# Patient Record
Sex: Female | Born: 1975 | Hispanic: Yes | Marital: Single | Smoking: Never smoker
Health system: Southern US, Community
[De-identification: ages and names within clinical notes are randomized; demographics above are authoritative.]

## PROBLEM LIST (undated history)

## (undated) DIAGNOSIS — E119 Type 2 diabetes mellitus without complications: Secondary | ICD-10-CM

---

## 2000-10-04 ENCOUNTER — Other Ambulatory Visit: Admission: RE | Admit: 2000-10-04 | Discharge: 2000-10-04 | Payer: Self-pay | Admitting: Obstetrics and Gynecology

## 2000-11-16 ENCOUNTER — Inpatient Hospital Stay (HOSPITAL_COMMUNITY): Admission: RE | Admit: 2000-11-16 | Discharge: 2000-11-18 | Payer: Self-pay | Admitting: Obstetrics and Gynecology

## 2001-03-24 ENCOUNTER — Emergency Department (HOSPITAL_COMMUNITY): Admission: EM | Admit: 2001-03-24 | Discharge: 2001-03-24 | Payer: Self-pay | Admitting: *Deleted

## 2015-01-29 ENCOUNTER — Other Ambulatory Visit (HOSPITAL_COMMUNITY): Payer: Self-pay | Admitting: Nurse Practitioner

## 2015-01-29 DIAGNOSIS — Z1231 Encounter for screening mammogram for malignant neoplasm of breast: Secondary | ICD-10-CM

## 2015-02-17 ENCOUNTER — Ambulatory Visit (HOSPITAL_COMMUNITY): Payer: Self-pay

## 2015-05-01 ENCOUNTER — Inpatient Hospital Stay (HOSPITAL_COMMUNITY): Payer: Medicaid Other

## 2015-05-01 ENCOUNTER — Inpatient Hospital Stay (HOSPITAL_COMMUNITY)
Admission: EM | Admit: 2015-05-01 | Discharge: 2015-05-12 | DRG: 536 | Disposition: A | Discharge status: Home / Self Care | Payer: Medicaid Other | Attending: General Surgery | Admitting: General Surgery

## 2015-05-01 ENCOUNTER — Emergency Department (HOSPITAL_COMMUNITY): Payer: Medicaid Other

## 2015-05-01 ENCOUNTER — Encounter (HOSPITAL_COMMUNITY): Payer: Self-pay | Admitting: *Deleted

## 2015-05-01 DIAGNOSIS — R52 Pain, unspecified: Secondary | ICD-10-CM

## 2015-05-01 DIAGNOSIS — S32810A Multiple fractures of pelvis with stable disruption of pelvic ring, initial encounter for closed fracture: Secondary | ICD-10-CM | POA: Diagnosis present

## 2015-05-01 DIAGNOSIS — S069XAA Unspecified intracranial injury with loss of consciousness status unknown, initial encounter: Secondary | ICD-10-CM | POA: Diagnosis present

## 2015-05-01 DIAGNOSIS — I959 Hypotension, unspecified: Secondary | ICD-10-CM | POA: Diagnosis present

## 2015-05-01 DIAGNOSIS — M25421 Effusion, right elbow: Secondary | ICD-10-CM

## 2015-05-01 DIAGNOSIS — IMO0002 Reserved for concepts with insufficient information to code with codable children: Secondary | ICD-10-CM

## 2015-05-01 DIAGNOSIS — S069X0A Unspecified intracranial injury without loss of consciousness, initial encounter: Secondary | ICD-10-CM

## 2015-05-01 DIAGNOSIS — R000 Tachycardia, unspecified: Secondary | ICD-10-CM | POA: Diagnosis present

## 2015-05-01 DIAGNOSIS — S069X9A Unspecified intracranial injury with loss of consciousness of unspecified duration, initial encounter: Secondary | ICD-10-CM | POA: Diagnosis present

## 2015-05-01 DIAGNOSIS — S060X0A Concussion without loss of consciousness, initial encounter: Secondary | ICD-10-CM | POA: Diagnosis present

## 2015-05-01 DIAGNOSIS — D62 Acute posthemorrhagic anemia: Secondary | ICD-10-CM | POA: Diagnosis present

## 2015-05-01 DIAGNOSIS — S3282XA Multiple fractures of pelvis without disruption of pelvic ring, initial encounter for closed fracture: Secondary | ICD-10-CM | POA: Diagnosis present

## 2015-05-01 DIAGNOSIS — M542 Cervicalgia: Secondary | ICD-10-CM

## 2015-05-01 HISTORY — DX: Type 2 diabetes mellitus without complications: E11.9

## 2015-05-01 LAB — PROTIME-INR
INR: 1.15 (ref 0.00–1.49)
PROTHROMBIN TIME: 14.9 s (ref 11.6–15.2)

## 2015-05-01 LAB — COMPREHENSIVE METABOLIC PANEL
ALBUMIN: 3.2 g/dL — AB (ref 3.5–5.0)
ALT: 71 U/L — ABNORMAL HIGH (ref 14–54)
ANION GAP: 10 (ref 5–15)
AST: 119 U/L — ABNORMAL HIGH (ref 15–41)
Alkaline Phosphatase: 67 U/L (ref 38–126)
BILIRUBIN TOTAL: 0.2 mg/dL — AB (ref 0.3–1.2)
BUN: 11 mg/dL (ref 6–20)
CO2: 23 mmol/L (ref 22–32)
Calcium: 8.1 mg/dL — ABNORMAL LOW (ref 8.9–10.3)
Chloride: 106 mmol/L (ref 101–111)
Creatinine, Ser: 0.81 mg/dL (ref 0.44–1.00)
GFR calc Af Amer: >60 mL/min (ref >60)
GFR calc non Af Amer: >60 mL/min (ref >60)
GLUCOSE: 185 mg/dL — AB (ref 65–99)
POTASSIUM: 3.2 mmol/L — AB (ref 3.5–5.1)
SODIUM: 139 mmol/L (ref 135–145)
TOTAL PROTEIN: 5.7 g/dL — AB (ref 6.5–8.1)

## 2015-05-01 LAB — CBC
HCT: 29.7 % — ABNORMAL LOW (ref 36.0–46.0)
HEMATOCRIT: 33.3 % — AB (ref 36.0–46.0)
HEMOGLOBIN: 11.1 g/dL — AB (ref 12.0–15.0)
Hemoglobin: 10.1 g/dL — ABNORMAL LOW (ref 12.0–15.0)
MCH: 30.3 pg (ref 26.0–34.0)
MCH: 30.3 pg (ref 26.0–34.0)
MCHC: 33.3 g/dL (ref 30.0–36.0)
MCHC: 34 g/dL (ref 30.0–36.0)
MCV: 91 fL (ref 78.0–100.0)
MCV: 89.2 fL (ref 78.0–100.0)
Platelets: 237 10*3/uL (ref 150–400)
Platelets: 175 10*3/uL (ref 150–400)
RBC: 3.66 MIL/uL — AB (ref 3.87–5.11)
RBC: 3.33 MIL/uL — ABNORMAL LOW (ref 3.87–5.11)
RDW: 12.6 % (ref 11.5–15.5)
RDW: 13 % (ref 11.5–15.5)
WBC: 13.6 10*3/uL — ABNORMAL HIGH (ref 4.0–10.5)
WBC: 6.6 10*3/uL (ref 4.0–10.5)

## 2015-05-01 LAB — URINALYSIS, ROUTINE W REFLEX MICROSCOPIC
BILIRUBIN URINE: NEGATIVE
GLUCOSE, UA: NEGATIVE mg/dL
KETONES UR: NEGATIVE mg/dL
Leukocytes, UA: NEGATIVE
NITRITE: NEGATIVE
PH: 6 (ref 5.0–8.0)
PROTEIN: NEGATIVE mg/dL
Specific Gravity, Urine: 1.021 (ref 1.005–1.030)

## 2015-05-01 LAB — I-STAT CHEM 8, ED
BUN: 11 mg/dL (ref 6–20)
CALCIUM ION: 1.1 mmol/L — AB (ref 1.12–1.23)
CHLORIDE: 105 mmol/L (ref 101–111)
Creatinine, Ser: 0.7 mg/dL (ref 0.44–1.00)
Glucose, Bld: 178 mg/dL — ABNORMAL HIGH (ref 65–99)
HCT: 37 % (ref 36.0–46.0)
HEMOGLOBIN: 12.6 g/dL (ref 12.0–15.0)
Potassium: 3.2 mmol/L — ABNORMAL LOW (ref 3.5–5.1)
SODIUM: 140 mmol/L (ref 135–145)
TCO2: 21 mmol/L (ref 0–100)

## 2015-05-01 LAB — SAMPLE TO BLOOD BANK

## 2015-05-01 LAB — PREPARE RBC (CROSSMATCH)

## 2015-05-01 LAB — URINE MICROSCOPIC-ADD ON

## 2015-05-01 LAB — I-STAT BETA HCG BLOOD, ED (MC, WL, AP ONLY)

## 2015-05-01 LAB — ETHANOL: Alcohol, Ethyl (B): <5 mg/dL (ref <5)

## 2015-05-01 LAB — ABO/RH: ABO/RH(D): O POS

## 2015-05-01 LAB — CDS SEROLOGY

## 2015-05-01 MED ORDER — ONDANSETRON HCL 4 MG/2ML IJ SOLN
INTRAMUSCULAR | Status: AC
  Filled 2015-05-01: qty 2

## 2015-05-01 MED ORDER — SODIUM CHLORIDE 0.9 % IV SOLN
INTRAVENOUS | Status: AC | PRN
  Administered 2015-05-01 (×4): 1000 mL via INTRAVENOUS

## 2015-05-01 MED ORDER — ONDANSETRON HCL 4 MG/2ML IJ SOLN
4.0000 mg | Freq: Four times a day (QID) | INTRAMUSCULAR | Status: DC | PRN
  Administered 2015-05-01 – 2015-05-05 (×4): 4 mg via INTRAVENOUS
  Filled 2015-05-01 (×5): qty 2

## 2015-05-01 MED ORDER — POLYETHYLENE GLYCOL 3350 17 G PO PACK
17.0000 g | PACK | Freq: Every day | ORAL | Status: DC
  Administered 2015-05-03 – 2015-05-12 (×10): 17 g via ORAL
  Filled 2015-05-01 (×11): qty 1

## 2015-05-01 MED ORDER — ONDANSETRON HCL 4 MG PO TABS
4.0000 mg | ORAL_TABLET | Freq: Four times a day (QID) | ORAL | Status: DC | PRN
  Administered 2015-05-04 – 2015-05-07 (×3): 4 mg via ORAL
  Filled 2015-05-01 (×3): qty 1

## 2015-05-01 MED ORDER — SODIUM CHLORIDE 0.9 % IJ SOLN: 9.0000 mL | INTRAMUSCULAR | Status: DC | PRN

## 2015-05-01 MED ORDER — SODIUM CHLORIDE 0.9 % IV SOLN: Freq: Once | INTRAVENOUS | Status: AC

## 2015-05-01 MED ORDER — NALOXONE HCL 0.4 MG/ML IJ SOLN: 0.4000 mg | INTRAMUSCULAR | Status: DC | PRN

## 2015-05-01 MED ORDER — PANTOPRAZOLE SODIUM 40 MG IV SOLR
40.0000 mg | Freq: Every day | INTRAVENOUS | Status: DC
  Administered 2015-05-02: 40 mg via INTRAVENOUS
  Filled 2015-05-01: qty 40

## 2015-05-01 MED ORDER — ONDANSETRON HCL 4 MG/2ML IJ SOLN
4.0000 mg | Freq: Once | INTRAMUSCULAR | Status: AC
  Administered 2015-05-01: 4 mg via INTRAVENOUS
  Filled 2015-05-01: qty 2

## 2015-05-01 MED ORDER — DIPHENHYDRAMINE HCL 50 MG/ML IJ SOLN: 12.5000 mg | Freq: Four times a day (QID) | INTRAMUSCULAR | Status: DC | PRN

## 2015-05-01 MED ORDER — IOHEXOL 300 MG/ML  SOLN
100.0000 mL | Freq: Once | INTRAMUSCULAR | Status: AC | PRN
  Administered 2015-05-01: 100 mL via INTRAVENOUS

## 2015-05-01 MED ORDER — ONDANSETRON HCL 4 MG/2ML IJ SOLN
4.0000 mg | Freq: Once | INTRAMUSCULAR | Status: AC
  Administered 2015-05-01: 4 mg via INTRAVENOUS

## 2015-05-01 MED ORDER — PANTOPRAZOLE SODIUM 40 MG PO TBEC
40.0000 mg | DELAYED_RELEASE_TABLET | Freq: Every day | ORAL | Status: DC
  Administered 2015-05-01 – 2015-05-12 (×11): 40 mg via ORAL
  Filled 2015-05-01 (×11): qty 1

## 2015-05-01 MED ORDER — FENTANYL CITRATE (PF) 100 MCG/2ML IJ SOLN
100.0000 ug | Freq: Once | INTRAMUSCULAR | Status: AC
  Administered 2015-05-01: 100 ug via INTRAVENOUS
  Filled 2015-05-01: qty 2

## 2015-05-01 MED ORDER — MORPHINE SULFATE 2 MG/ML IV SOLN
INTRAVENOUS | Status: DC
  Administered 2015-05-01: 13 mg via INTRAVENOUS
  Administered 2015-05-01: 19:00:00 via INTRAVENOUS
  Filled 2015-05-01: qty 25

## 2015-05-01 MED ORDER — KCL IN DEXTROSE-NACL 40-5-0.45 MEQ/L-%-% IV SOLN
INTRAVENOUS | Status: DC
  Administered 2015-05-01 – 2015-05-02 (×2): via INTRAVENOUS
  Administered 2015-05-02: 125 mL/h via INTRAVENOUS
  Administered 2015-05-03: 21:00:00 via INTRAVENOUS
  Administered 2015-05-03: 75 mL/h via INTRAVENOUS
  Administered 2015-05-04: 50 mL/h via INTRAVENOUS
  Administered 2015-05-05 – 2015-05-06 (×3): via INTRAVENOUS
  Filled 2015-05-01 (×13): qty 1000

## 2015-05-01 MED ORDER — DIPHENHYDRAMINE HCL 12.5 MG/5ML PO ELIX: 12.5000 mg | ORAL_SOLUTION | Freq: Four times a day (QID) | ORAL | Status: DC | PRN

## 2015-05-01 MED ORDER — DOCUSATE SODIUM 100 MG PO CAPS
100.0000 mg | ORAL_CAPSULE | Freq: Two times a day (BID) | ORAL | Status: DC
  Administered 2015-05-01 – 2015-05-12 (×21): 100 mg via ORAL
  Filled 2015-05-01 (×21): qty 1

## 2015-05-01 MED ORDER — FENTANYL CITRATE (PF) 100 MCG/2ML IJ SOLN
50.0000 ug | INTRAMUSCULAR | Status: DC | PRN
  Administered 2015-05-01 – 2015-05-02 (×5): 50 ug via INTRAVENOUS
  Filled 2015-05-01 (×6): qty 2

## 2015-05-01 NOTE — ED Notes (Signed)
Pt escorted to CT by this RN.

## 2015-05-01 NOTE — ED Notes (Signed)
Plasma started at 16020mL/hr verified with Tammy SoursGreg, RN

## 2015-05-01 NOTE — ED Notes (Signed)
Pt being taken to CT scan now by Marga HootsNicole Stephens, RN

## 2015-05-01 NOTE — ED Notes (Signed)
Plasma increased to 26700mL/hr
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
## 2015-05-01 NOTE — ED Notes (Signed)
X-ray at bedside
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
## 2015-05-01 NOTE — Progress Notes (Signed)
[facility]   Consult requested for multiple pelvic ring fractures  Attempted see patient in the emergency department but she is off for this time  All pelvic imaging as well as her extremity images were reviewed  No acute findings noted to her right lower leg or right femur. No acute findings noted to the right foot  CT of her abdomen pelvis is notable for a incomplete fracture of her S1 sacral body. There does appear to be slight pars fracture on the left side as well. I do not appreciate any significant extension of sacral body fracture anteriorly or posteriorly. It seems to be isolated within the body itself. There also are bilateral superior rami fractures as well as a left inferior rami fracture. Also appears be some small avulsions around the pubic symphysis. The SI joints appear to be symmetric as well without gross injury.  Plan for a complete exam tomorrow  Based on the imaging studies we do believe we treated nonoperatively however we will have the patient be npo after midnight in case clinical findings dictate otherwise  Continue bedrest for now  Patient also appears to have responded to blood product as her H&H are   improved and her vitals are stable  Seems unlikely that her pelvic ring fractures cause her significant hypotension.  I have ordered additional pelvic x-rays as well  Mearl LatinKeith W. Colten Desroches, PA-C [facility] [phone] (P) 05/01/2015 3:23 PM

## 2015-05-01 NOTE — ED Notes (Signed)
Dr. Criss AlvineGoldston informed of pts BP decreasing to 85/72.
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
## 2015-05-02 ENCOUNTER — Inpatient Hospital Stay (HOSPITAL_COMMUNITY): Payer: Medicaid Other

## 2015-05-02 LAB — CBC
HEMATOCRIT: 31.4 % — AB (ref 36.0–46.0)
HEMATOCRIT: 33.6 % — AB (ref 36.0–46.0)
HEMATOCRIT: 37 % (ref 36.0–46.0)
HEMOGLOBIN: 10.5 g/dL — AB (ref 12.0–15.0)
HEMOGLOBIN: 12 g/dL (ref 12.0–15.0)
Hemoglobin: 11.3 g/dL — ABNORMAL LOW (ref 12.0–15.0)
MCH: 29.9 pg (ref 26.0–34.0)
MCH: 30.6 pg (ref 26.0–34.0)
MCH: 29.6 pg (ref 26.0–34.0)
MCHC: 33.4 g/dL (ref 30.0–36.0)
MCHC: 33.6 g/dL (ref 30.0–36.0)
MCHC: 32.4 g/dL (ref 30.0–36.0)
MCV: 89.5 fL (ref 78.0–100.0)
MCV: 91.1 fL (ref 78.0–100.0)
MCV: 91.1 fL (ref 78.0–100.0)
PLATELETS: 167 10*3/uL (ref 150–400)
Platelets: 182 10*3/uL (ref 150–400)
Platelets: 179 10*3/uL (ref 150–400)
RBC: 3.51 MIL/uL — ABNORMAL LOW (ref 3.87–5.11)
RBC: 3.69 MIL/uL — AB (ref 3.87–5.11)
RBC: 4.06 MIL/uL (ref 3.87–5.11)
RDW: 13.5 % (ref 11.5–15.5)
RDW: 13.8 % (ref 11.5–15.5)
RDW: 13.8 % (ref 11.5–15.5)
WBC: 6.5 10*3/uL (ref 4.0–10.5)
WBC: 6.2 10*3/uL (ref 4.0–10.5)
WBC: 6.7 10*3/uL (ref 4.0–10.5)

## 2015-05-02 LAB — PREPARE FRESH FROZEN PLASMA
UNIT DIVISION: 0
UNIT DIVISION: 0

## 2015-05-02 LAB — MRSA PCR SCREENING: MRSA by PCR: NEGATIVE

## 2015-05-02 LAB — BASIC METABOLIC PANEL
Anion gap: 6 (ref 5–15)
CHLORIDE: 111 mmol/L (ref 101–111)
CO2: 22 mmol/L (ref 22–32)
Calcium: 8 mg/dL — ABNORMAL LOW (ref 8.9–10.3)
Creatinine, Ser: 0.56 mg/dL (ref 0.44–1.00)
GFR calc Af Amer: >60 mL/min (ref >60)
GLUCOSE: 140 mg/dL — AB (ref 65–99)
POTASSIUM: 3.9 mmol/L (ref 3.5–5.1)
Sodium: 139 mmol/L (ref 135–145)

## 2015-05-02 LAB — BLOOD PRODUCT ORDER (VERBAL) VERIFICATION

## 2015-05-02 LAB — TYPE AND SCREEN
ABO/RH(D): O POS
Antibody Screen: NEGATIVE
UNIT DIVISION: 0
Unit division: 0
Unit division: 0

## 2015-05-02 MED ORDER — CETYLPYRIDINIUM CHLORIDE 0.05 % MT LIQD
7.0000 mL | Freq: Two times a day (BID) | OROMUCOSAL | Status: DC
  Administered 2015-05-02 – 2015-05-04 (×4): 7 mL via OROMUCOSAL

## 2015-05-02 MED ORDER — SODIUM CHLORIDE 0.9 % IJ SOLN: 9.0000 mL | INTRAMUSCULAR | Status: DC | PRN

## 2015-05-02 MED ORDER — NALOXONE HCL 0.4 MG/ML IJ SOLN: 0.4000 mg | INTRAMUSCULAR | Status: DC | PRN

## 2015-05-02 MED ORDER — DIPHENHYDRAMINE HCL 50 MG/ML IJ SOLN: 12.5000 mg | Freq: Four times a day (QID) | INTRAMUSCULAR | Status: DC | PRN

## 2015-05-02 MED ORDER — DIPHENHYDRAMINE HCL 50 MG/ML IJ SOLN
12.5000 mg | Freq: Four times a day (QID) | INTRAMUSCULAR | Status: DC | PRN
  Administered 2015-05-03: 12.5 mg via INTRAVENOUS
  Filled 2015-05-02: qty 1

## 2015-05-02 MED ORDER — DIPHENHYDRAMINE HCL 12.5 MG/5ML PO ELIX: 12.5000 mg | ORAL_SOLUTION | Freq: Four times a day (QID) | ORAL | Status: DC | PRN

## 2015-05-02 MED ORDER — HYDROMORPHONE 1 MG/ML IV SOLN
INTRAVENOUS | Status: DC
  Administered 2015-05-02: 2.5 mg via INTRAVENOUS
  Administered 2015-05-02: 0.5 mg via INTRAVENOUS
  Administered 2015-05-02: 1.5 mg via INTRAVENOUS
  Administered 2015-05-03: 1.2 mg via INTRAVENOUS
  Administered 2015-05-03: 2 mg via INTRAVENOUS
  Administered 2015-05-03: 2.09 mg via INTRAVENOUS
  Administered 2015-05-03: 1.5 mg via INTRAVENOUS
  Administered 2015-05-04: 0.5 mg via INTRAVENOUS
  Filled 2015-05-02: qty 25

## 2015-05-02 MED ORDER — ONDANSETRON HCL 4 MG/2ML IJ SOLN: 4.0000 mg | Freq: Four times a day (QID) | INTRAMUSCULAR | Status: DC | PRN

## 2015-05-02 MED ORDER — ACETAMINOPHEN 325 MG PO TABS
325.0000 mg | ORAL_TABLET | Freq: Four times a day (QID) | ORAL | Status: DC | PRN
  Administered 2015-05-02 – 2015-05-10 (×8): 650 mg via ORAL
  Filled 2015-05-02 (×9): qty 2

## 2015-05-02 MED ORDER — METHOCARBAMOL 1000 MG/10ML IJ SOLN
1000.0000 mg | Freq: Three times a day (TID) | INTRAVENOUS | Status: DC
  Administered 2015-05-02 – 2015-05-05 (×9): 1000 mg via INTRAVENOUS
  Filled 2015-05-02 (×11): qty 10

## 2015-05-02 MED ORDER — PROMETHAZINE HCL 25 MG/ML IJ SOLN
12.5000 mg | Freq: Four times a day (QID) | INTRAMUSCULAR | Status: DC | PRN
  Administered 2015-05-02: 12.5 mg via INTRAVENOUS
  Administered 2015-05-03: 25 mg via INTRAVENOUS
  Filled 2015-05-02 (×3): qty 1

## 2015-05-02 MED ORDER — MORPHINE SULFATE 2 MG/ML IV SOLN
INTRAVENOUS | Status: DC
  Administered 2015-05-02: 3 mg via INTRAVENOUS
  Administered 2015-05-02: 0 mg via INTRAVENOUS
  Administered 2015-05-02: 9 mg via INTRAVENOUS

## 2015-05-02 NOTE — Progress Notes (Signed)
Inpatient Rehabilitation  Patient was screened by Fae PippinMelissa Taisia Fantini for appropriateness for an Inpatient Acute Rehab consult.  Patient may be appropriate for IP Rehab; however, pain, N/V appears to be limiting her ability to get out of bed at this time.  Will follow along for progress with therapies and appropriateness for a rehab consult.   Charlane FerrettiMelissa Keny Donald, M.A., CCC/SLP Admission Coordinator  [facility]  Cell [phone]

## 2015-05-02 NOTE — Progress Notes (Signed)
Dr. Janee Mornhompson notified patient's pain still 8-10 with PCA pump. Orders received to change PCA to full dose. Will monitor.
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
## 2015-05-02 NOTE — Progress Notes (Signed)
Attempted report X2 to 3S Water engineerN and Charge RN. No one was able to take report at this time.
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
## 2015-05-03 ENCOUNTER — Encounter (HOSPITAL_COMMUNITY): Payer: Self-pay | Admitting: *Deleted

## 2015-05-03 MED ORDER — ENOXAPARIN SODIUM 40 MG/0.4ML ~~LOC~~ SOLN
40.0000 mg | SUBCUTANEOUS | Status: DC
  Administered 2015-05-03 – 2015-05-12 (×10): 40 mg via SUBCUTANEOUS
  Filled 2015-05-03 (×10): qty 0.4
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
## 2015-05-03 NOTE — Progress Notes (Signed)
Pt continues to have headache, encouraged pt to use PCA, educated pt and family at bedside about PCA and that pt is the only one who can push the PCA button to deliver pain medication. They state they understand. Pt's daughter speaks [place] well, pt and her husband understand [place] well. Place ice pack behind pt's head around neck area per her request to help with pain. Marisue Ivanobyn Kito Cuffe RN

## 2015-05-04 MED ORDER — OXYCODONE HCL 5 MG PO TABS
5.0000 mg | ORAL_TABLET | ORAL | Status: DC | PRN
  Administered 2015-05-04 – 2015-05-05 (×5): 10 mg via ORAL
  Filled 2015-05-04 (×5): qty 2

## 2015-05-04 MED ORDER — HYDROMORPHONE HCL 1 MG/ML IJ SOLN
0.5000 mg | INTRAMUSCULAR | Status: DC | PRN
  Administered 2015-05-04 – 2015-05-05 (×5): 2 mg via INTRAVENOUS
  Filled 2015-05-04 (×5): qty 2
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
## 2015-05-04 NOTE — Progress Notes (Signed)
Wasted 6mls dilaudid from d/c'd PCA with Agricultural consultantCelenia RN. Marisue Ivanobyn Blake Vetrano RN
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
## 2015-05-05 ENCOUNTER — Inpatient Hospital Stay (HOSPITAL_COMMUNITY): Payer: Medicaid Other

## 2015-05-05 LAB — CBC
HEMATOCRIT: 35.7 % — AB (ref 36.0–46.0)
HEMOGLOBIN: 12.1 g/dL (ref 12.0–15.0)
MCH: 30.8 pg (ref 26.0–34.0)
MCHC: 33.9 g/dL (ref 30.0–36.0)
MCV: 90.8 fL (ref 78.0–100.0)
Platelets: 219 10*3/uL (ref 150–400)
RBC: 3.93 MIL/uL (ref 3.87–5.11)
RDW: 12.9 % (ref 11.5–15.5)
WBC: 10.3 10*3/uL (ref 4.0–10.5)

## 2015-05-05 MED ORDER — TRAMADOL HCL 50 MG PO TABS
50.0000 mg | ORAL_TABLET | Freq: Four times a day (QID) | ORAL | Status: DC
  Administered 2015-05-05 – 2015-05-12 (×30): 100 mg via ORAL
  Filled 2015-05-05 (×30): qty 2

## 2015-05-05 MED ORDER — OXYCODONE HCL 5 MG PO TABS
5.0000 mg | ORAL_TABLET | ORAL | Status: DC | PRN
  Administered 2015-05-05: 5 mg via ORAL
  Administered 2015-05-06 – 2015-05-08 (×14): 15 mg via ORAL
  Administered 2015-05-09: 10 mg via ORAL
  Administered 2015-05-09 (×2): 15 mg via ORAL
  Administered 2015-05-11 – 2015-05-12 (×3): 10 mg via ORAL
  Filled 2015-05-05 (×6): qty 3
  Filled 2015-05-05: qty 2
  Filled 2015-05-05 (×3): qty 3
  Filled 2015-05-05: qty 2
  Filled 2015-05-05: qty 1
  Filled 2015-05-05: qty 3
  Filled 2015-05-05: qty 2
  Filled 2015-05-05 (×7): qty 3

## 2015-05-05 MED ORDER — HYDROMORPHONE HCL 1 MG/ML IJ SOLN
0.5000 mg | INTRAMUSCULAR | Status: DC | PRN
  Administered 2015-05-05: 2 mg via INTRAVENOUS
  Filled 2015-05-05: qty 2

## 2015-05-05 MED ORDER — METHOCARBAMOL 500 MG PO TABS: 1000.0000 mg | ORAL_TABLET | Freq: Three times a day (TID) | ORAL | Status: DC

## 2015-05-05 MED ORDER — METHOCARBAMOL 500 MG PO TABS
1000.0000 mg | ORAL_TABLET | Freq: Three times a day (TID) | ORAL | Status: DC
  Administered 2015-05-05 – 2015-05-12 (×22): 1000 mg via ORAL
  Filled 2015-05-05 (×22): qty 2
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
## 2015-05-06 DIAGNOSIS — S069X9S Unspecified intracranial injury with loss of consciousness of unspecified duration, sequela: Secondary | ICD-10-CM

## 2015-05-06 DIAGNOSIS — S069XAA Unspecified intracranial injury with loss of consciousness status unknown, initial encounter: Secondary | ICD-10-CM | POA: Diagnosis present

## 2015-05-06 DIAGNOSIS — R52 Pain, unspecified: Secondary | ICD-10-CM | POA: Diagnosis present

## 2015-05-06 DIAGNOSIS — S069X9A Unspecified intracranial injury with loss of consciousness of unspecified duration, initial encounter: Secondary | ICD-10-CM | POA: Diagnosis present

## 2015-05-06 DIAGNOSIS — R000 Tachycardia, unspecified: Secondary | ICD-10-CM | POA: Diagnosis present

## 2015-05-06 DIAGNOSIS — S32810A Multiple fractures of pelvis with stable disruption of pelvic ring, initial encounter for closed fracture: Secondary | ICD-10-CM | POA: Diagnosis present

## 2015-05-06 MED ORDER — BISACODYL 10 MG RE SUPP
10.0000 mg | Freq: Every day | RECTAL | Status: DC | PRN
  Administered 2015-05-07: 10 mg via RECTAL
  Filled 2015-05-06: qty 1

## 2015-05-06 NOTE — Progress Notes (Signed)
Interpreter [name] for [facility]
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
## 2015-05-06 NOTE — Progress Notes (Signed)
Inpatient Rehabilitation  I visited pt. At the bedside to attempt to discuss her progress in therapies and possible need for post acute rehab.  Pt's uncle Lynne Loganrnesto present and speaking a little [place].  Through uncle, I advised pt. That I will return tomorrow hopefully with interpreter to discuss the above.  Pt. In agreement.  Please call if questions.  Weldon PickingSusan Kevyn Boquet PT Inpatient Rehab Admissions Coordinator Cell [phone] Office [phone]

## 2015-05-07 ENCOUNTER — Inpatient Hospital Stay (HOSPITAL_COMMUNITY): Payer: Medicaid Other

## 2015-05-07 MED ORDER — GLUCERNA SHAKE PO LIQD
237.0000 mL | Freq: Three times a day (TID) | ORAL | Status: DC
  Administered 2015-05-07 – 2015-05-12 (×11): 237 mL via ORAL
  Filled 2015-05-07: qty 237
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
## 2015-05-07 NOTE — Progress Notes (Signed)
Report called pt transferring to [id number].

## 2015-05-07 NOTE — Progress Notes (Signed)
Inpatient Rehabilitation  I met at the bedside with the patient, her 41 year old daughter Grant Fontana .  Lesle Chris provided interpretation.  I was able to observe pt. In her PT/OT session as well.  Pt. Currently with ongoing functional limitations due to pain and some dizziness.  ST consult pending for cognitive issues.  Pt. with heart rate max 138 with limited activity.  Will continue to follow for possible need for IP Rehab and for medical readiness.  Discussed with Jomarie Longs, trauma PA.  Please call if questions.  [place] Admissions Coordinator Cell [phone] Office [phone]
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
## 2015-05-07 NOTE — Progress Notes (Signed)
Interpreter [name] for Durward MallardJulie OT, MeganPT, Susan [facility]
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
## 2015-05-08 MED ORDER — WHITE PETROLATUM GEL
Status: AC
  Administered 2015-05-08: 13:00:00
  Filled 2015-05-08: qty 1
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
## 2015-05-08 NOTE — Progress Notes (Signed)
Interpreter [name] for Physical Therapist Aundra MilletMegan
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
## 2015-05-09 NOTE — Progress Notes (Addendum)
Inpatient Rehabilitation  Met with patient at bedside with interpreter, Spero Geralds present and called husband via speaker phone to discuss potential IP Rehab admission.  Notified of need for 24/7 supervision.  Husband asked his mother who agreed to care for patient.  Plan to meeting with patient and her mother-in-law Monday @ 90 with interpreter to solidify discharge support.    Carmelia Roller., CCC/SLP Admission Coordinator  [place]  Cell [phone]
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
## 2015-05-09 NOTE — Progress Notes (Signed)
Interpreter [name] for [facility], and  [facility]
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
## 2015-05-12 MED ORDER — METHOCARBAMOL 500 MG PO TABS: 1000.0000 mg | ORAL_TABLET | Freq: Three times a day (TID) | ORAL | Status: DC | PRN

## 2015-05-12 MED ORDER — ACETAMINOPHEN 325 MG PO TABS: 325.0000 mg | ORAL_TABLET | Freq: Four times a day (QID) | ORAL | Status: DC | PRN

## 2015-05-12 MED ORDER — POLYETHYLENE GLYCOL 3350 17 G PO PACK: 17.0000 g | PACK | Freq: Every day | ORAL | Status: DC

## 2015-05-12 MED ORDER — TRAMADOL HCL 50 MG PO TABS: 50.0000 mg | ORAL_TABLET | Freq: Four times a day (QID) | ORAL | Status: DC | PRN

## 2015-05-12 MED ORDER — OXYCODONE HCL 5 MG PO TABS: 5.0000 mg | ORAL_TABLET | ORAL | Status: DC | PRN

## 2015-05-12 MED ORDER — GLUCERNA SHAKE PO LIQD
237.0000 mL | Freq: Three times a day (TID) | ORAL | Status: DC
Start: 2015-05-12 — End: 2019-04-22

## 2015-05-12 MED ORDER — DOCUSATE SODIUM 100 MG PO CAPS: 100.0000 mg | ORAL_CAPSULE | Freq: Two times a day (BID) | ORAL | Status: DC

## 2015-05-12 NOTE — Progress Notes (Signed)
Inpatient Rehabilitation  I met the patient and her mother in law at the bedside to discuss rehab plans for pt.  Per PT note this am, pt. supervision and min assist 100'  ambulation .  Pt. in room walking with RW and supervision currently.  Pt. appears much more comfortable and is moving more freely.  Pt. no longer requires the intensity of the CIR program .  Pt's mother in law and pt. state that they feel like they will manage fine at home and that pt. Will have 24 hour supervision and assist as needed.   All communication was done through [employer], interpreter.  I have updated Ellan Lambert, RNCM.  I will sign off.  Please call if questions.  [place] Admissions Coordinator Cell [phone] Office [phone]
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
## 2015-05-12 NOTE — Progress Notes (Signed)
Interpreter [name] for [facility]

## 2015-05-12 NOTE — Progress Notes (Signed)
Pt discharged to home. Discharge instructions explained to pt through family member. Contacted case management to get pt assistance with getting prescriptions and pt was able to get a discount coupon.  Pt stated she had all belongings.  Pt ambulated off unit on her own.

## 2015-05-12 NOTE — Progress Notes (Signed)
CSW reviewed Pt's chart and the Pt would like to d/c home with 24 hours supervision.   CSW signing off.   Lenord CarboCassandra Aaleigha Bozza  LCSWA [facility]  [phone]

## 2015-05-12 NOTE — Progress Notes (Addendum)
ED[facility].  Patient without insurance, to be discharged home with family.  [facility] provided patient with coupons from [employer] to receive discount prices for medications at [employer].  RN has discussed this with patient and patient is agreeable to cost of medications.  [facility] informed RN that unable to assist with colace as it is over the counter.  [facility] faxed coupons to 6N at [phone] with confirmation of receipt.  No further EDCM needs at this time.  1820pm [facility] 6N RN confirms receipt of coupons.
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
## 2015-05-12 NOTE — Progress Notes (Signed)
Interpreter [name] for [facility]

## 2015-05-12 NOTE — Discharge Instructions (Signed)
Conmocin cerebral - Adultos (Concussion, Adult) Una conmocin es una lesin cerebral. Las causas pueden ser: Un golpe en la cabeza. Un movimiento rpido y brusco (sacudida) de la cabeza o el cuello. Generalmente no pone en peligro la vida. Sin embargo, puede causar problemas graves. Si sufri una conmocin cerebral antes, puede tener sntomas similares a la conmocin despus de un golpe en la cabeza. CUIDADOS EN EL HOGAR Instrucciones generales Siga cuidadosamente las indicaciones del mdico. Tome los medicamentos como le indic el mdico. Solo tome los medicamentos que su mdico considere seguros. No beba alcohol hasta que el mdico lo autorice. El alcohol y algunos medicamentos pueden hacer ms lenta la curacin. Tambin pueden ponerlo en riesgo de sufrir otras lesiones. Si tiene dificultad para recordar las cosas, escrbalas. Trate de hacer una cosa por vez si se distrae con facilidad. Por ejemplo, no mire televisin mientras prepara la cena. Consulte con familiares y amigos si debe tomar decisiones importantes. Concurra a las consultas de control con el mdico, segn las indicaciones. Observe sus sntomas. Dgale a los dems que hagan lo mismo. En algunos casos, despus de una conmocin cerebral surgen problemas graves. Es ms probable que [employer] graves los sufran los adultos [place]. Informe a los [address], el departamento de enfermera de la escuela, el consejero escolar, Public affairs consultant acerca de su conmocin cerebral. Hgales saber lo que puede y lo que no Scientist, product/process development. Ellos debern observarlo para ver si: Tiene ms dificultad para prestar atencin o concentrarse. Le cuesta an ms recordar las cosas o aprender cosas nuevas. Necesita ms tiempo que lo habitual para finalizar las tareas. Est ms molesto (irritable) que antes. Tampoco puede Charity fundraiser. Tiene ms problemas que antes. Haga reposo. Asegrese de que: Duerme bien por la noche. Se va a dormir  temprano. Acustese [employer] a la misma hora aproximadamente. Trate de despertarse a la misma hora. Descanse Administrator. Tome una siesta cuando se sienta cansado. Limite las actividades que requieran mucha concentracin. Estas pueden ser: Hacer la tarea para Advice worker. Hacer tareas relacionadas con un trabajo. Mirar televisin. Usar la computadora. Regreso a las [employer] a las actividades habituales gradualmente, no [place] hacer todo de Building control surveyor. Debe darles al cuerpo y el cerebro su tiempo para recuperarse.  No practique deportes ni realice otras actividades deportivas hasta que el mdico lo autorice. Consulte a su mdico cundo puede andar en bicicleta o conducir otros vehculos o mquinas. Nunca haga estas cosas si se siente mareado. Pregunte a su mdico cundo podr volver a la escuela o al Aleen Campi. Prevencin de otra conmocin cerebral Es muy importante que evite otra lesin cerebral, especialmente antes de que se haya recuperado. En casos raros, una nueva lesin puede causar daos cerebrales permanentes, hinchazn del cerebro o la muerte. El riesgo es mayor durante los primeros 7 a 10das despus de una lesin. Evite las lesiones:  Use el cinturn de seguridad al conducir un automvil. Evite beber alcohol en exceso. Evite las actividades que puedan favorecer una segunda conmocin cerebral (como al practicar deportes de contacto). Use un casco cuando practique actividades como: Andar en bicicleta. Esquiar. Andar en patineta. Andar en patines. Haga de su hogar un lugar ms seguro: Retire las cosas del piso o de las escaleras que podran hacerlo tropezar. Coloque barras en los baos y pasamanos en las escaleras. Ponga alfombras antideslizantes en pisos y baeras. Mejore la iluminacin en zonas de penumbra. SOLICITE AYUDA SI: Tiene ms dificultad para prestar atencin  o concentrarse. Le cuesta an ms recordar las cosas o aprender cosas  nuevas. Necesita ms tiempo que lo habitual para finalizar las tareas. Est ms molesto (irritable) que antes. Tampoco puede Charity fundraisercontrolar el estrs. Tiene ms problemas que antes. Tiene problemas para mantener el equilibrio. No logra reaccionar rpidamente cuando lo necesita. Solicite ayuda si tiene alguno de estos problemas durante ms de 2 semanas:  Dolor de cabeza que perdura (crnico). Mareos o problemas de equilibrio. Ganas de vomitar (nuseas). Problemas para ver (de vista). Sentirse afectado por ruidos o la luz ms que lo normal. Sentir tristeza, decaimiento, sufrimiento espiritual, melancola, pesimismo o vaco (depresin). Cambios de humor (cambios en el estado de nimo). Sensacin de miedo o nerviosismo por lo que podra ocurrir (ansiedad). Se siente abrumado. Problemas de memoria. Dificultad para concentrarse o Engineer, technical salesmantener la atencin. Problemas para dormir. Cansancio permanente. SOLICITE AYUDA DE INMEDIATO SI:  Tiene dolores de cabeza intensos o estos empeoran. Siente debilidad (aun si es solo en Thayeruna mano, una pierna o parte del rostro). Tiene falta de sensibilidad (adormecimiento). Siente que pierde el equilibrio. No deja de vomitar. Siente cansancio. Uno de los centros negros del ojo (pupila) es ms grande que el otro. Se retuerce o se sacude violentamente (tiene convulsiones). El habla no es clara (arrastra las palabras). Se siente ms confundido, se enoja con ms facilidad (agitacin) o est ms irritable que antes. Tiene ms dificultad para descansar que antes. No puede Nutritional therapistreconocer personas o lugares. Siente dolor en el cuello. Le resulta difcil despertarse. Tiene cambios de conducta inusuales. Se desmaya (pierde el conocimiento). ASEGRESE DE QUE:  Comprende estas instrucciones. Controlar su afeccin. Recibir ayuda de inmediato si no mejora o si empeora.   Esta informacin no tiene Theme park managercomo fin reemplazar el consejo del mdico. Asegrese de hacerle al mdico  cualquier pregunta que tenga.   Document Released: 05/15/2010 Document Revised: 05/03/2014 Elsevier Interactive Patient Education 2016 [employer]   Fractura simple de la pelvis en adultos (Simple Pelvic Fracture, Adult) La fractura de la pelvis es la rotura en uno de los huesos plvicos. Los huesos plvicos incluyen Genworth Financialaquellos sobre los que uno se sienta y aquellos que forman la parte inferior de la columna. La fractura de la pelvis se llama simple si los huesos rotos estn estables y no se desplazan. CAUSAS  Las causas frecuentes de este tipo de fractura incluyen lo siguiente:  Neomia DearUna cada.  Un accidente automovilstico.  La aplicacin de fuerza o presin sobre la pelvis. FACTORES DE RIESGO Puede correr un riesgo mayor de tener este tipo de fractura si:  Practica deportes de alto impacto.  Es Neomia Dearuna persona de edad avanzada y tiene una afeccin que causa huesos frgiles (osteoporosis).  Tiene una enfermedad que Affiliated Computer Servicesdebilita los huesos. Blake DivineSIGNOS Y SNTOMAS Los signos y sntomas pueden incluir los siguientes:  Dolor a la palpacin, hinchazn o hematomas en el rea afectada.  Dolor al Merchant navy officermover la cadera.  Dolor al caminar o estar de pie. DIAGNSTICO El diagnstico se establece con un examen fsico y radiografas. A veces, tambin se realiza una tomografa computarizada. TRATAMIENTO El objetivo del tratamiento es que los huesos se consoliden en una buena posicin. Por lo general, el tratamiento de una fractura simple de la pelvis Conservation officer, natureincluye permanecer en cama (reposo en cama) y usar muletas o un andador hasta que los huesos se Five Forksconsoliden. Pueden recetarse analgsicos, adems de otros medicamentos que ayudan a Automotive engineerevitar la formacin de cogulos sanguneos en las piernas. INSTRUCCIONES PARA EL CUIDADO EN EL HOGAR Control del dolor, la  rigidez y la hinchazn  Si se lo indican, aplique hielo sobre la zona lesionada:  Ponga el hielo en una bolsa plstica.  Coloque una toalla entre la piel y la  bolsa de hielo.  Coloque el hielo durante 20 minutos, 2 a 3 veces por da.  Cuando est sentado o acostado, mantenga la zona lesionada por encima del nivel del corazn. Conducir  No conduzca ni opere mquinas pesadas hasta que su mdico le diga que no corre riesgos. Actividad  Haga reposo en cama durante todo el tiempo que le haya indicado el mdico.  Durante el reposo en cama:  Cambie la posicin de las piernas cada 1a 2horas. Esto hace que la sangre circule como corresponde por ambas piernas.  Puede sentarse durante todo el tiempo que le resulte cmodo.  Despus del reposo en cama:  Evite las actividades intensas durante todo el tiempo que le haya indicado el mdico.  Reanude sus actividades normales como se lo haya indicado el mdico. Pregntele al mdico qu actividades son seguras para usted. Seguridad  No apoye el peso del cuerpo sobre la extremidad [employer] que lo autorice el mdico. Use muletas o un andador, segn las indicaciones del mdico. Instrucciones generales  No consuma ningn producto que contenga tabaco, lo que incluye cigarrillos, tabaco de Theatre manager o Administrator, Civil Service. El tabaco puede retardar la consolidacin de la fractura. Si necesita ayuda para dejar de fumar, consulte al mdico.  Tome los medicamentos solamente como se lo haya indicado el mdico.  Concurra a todas las visitas de control como se lo haya indicado el mdico. Esto es importante. SOLICITE ATENCIN MDICA SI:  El dolor empeora.  El dolor no se alivia con los [employer]. SOLICITE ATENCIN MDICA DE INMEDIATO SI:  Se desmaya o tiene sensacin de desvanecimiento.  Siente dolor en el pecho.  Le falta el aire.  Tiene fiebre.  Observa sangre en la orina o en las heces.  Tiene una hemorragia vaginal abundante.  Siente dolor o tiene dificultades para Automotive engineer.  Tiene dificultades o le Teacher, music al caminar.  Se le hincha una de las piernas o le aumenta  la hinchazn.  Tiene adormecidas las piernas o la zona de la ingle.   Esta informacin no tiene Theme park manager el consejo del mdico. Asegrese de hacerle al mdico cualquier pregunta que tenga.   Document Released: 01/20/2005 Document Revised: 05/03/2014 Elsevier Interactive Patient Education [employer]

## 2015-05-15 ENCOUNTER — Encounter: Payer: Self-pay | Admitting: Rehabilitation

## 2015-05-15 ENCOUNTER — Encounter: Payer: Self-pay | Admitting: Occupational Therapy

## 2015-05-15 ENCOUNTER — Ambulatory Visit: Payer: Self-pay | Attending: General Surgery | Admitting: Rehabilitation

## 2015-05-15 ENCOUNTER — Ambulatory Visit: Payer: Self-pay | Admitting: Occupational Therapy

## 2015-05-15 DIAGNOSIS — M79604 Pain in right leg: Secondary | ICD-10-CM | POA: Insufficient documentation

## 2015-05-15 DIAGNOSIS — R2681 Unsteadiness on feet: Secondary | ICD-10-CM | POA: Insufficient documentation

## 2015-05-15 DIAGNOSIS — Z7409 Other reduced mobility: Secondary | ICD-10-CM

## 2015-05-15 DIAGNOSIS — R41841 Cognitive communication deficit: Secondary | ICD-10-CM | POA: Insufficient documentation

## 2015-05-15 DIAGNOSIS — R6889 Other general symptoms and signs: Secondary | ICD-10-CM | POA: Insufficient documentation

## 2015-05-15 DIAGNOSIS — M436 Torticollis: Secondary | ICD-10-CM | POA: Insufficient documentation

## 2015-05-15 DIAGNOSIS — M25559 Pain in unspecified hip: Secondary | ICD-10-CM

## 2015-05-15 DIAGNOSIS — M542 Cervicalgia: Secondary | ICD-10-CM | POA: Insufficient documentation

## 2015-05-15 DIAGNOSIS — M79605 Pain in left leg: Secondary | ICD-10-CM | POA: Insufficient documentation

## 2015-05-15 NOTE — Patient Instructions (Signed)
Demonstrated use of the following ADL equipment to increase independence in self care at home: Sock aide, long handled shoe horn, long handled sponge, elastic shoe laces. Also recommended that pt could temporarily put lawn chair into shower to assist with standing tolerance/pain. Pt and dtr instructed in where to obtain (via intepreter)
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
## 2015-05-15 NOTE — Patient Instructions (Signed)
AROM: Neck Rotation    Turn head slowly to look over one shoulder, then the other. Hold each position _5___ seconds.  Don't push too much into pain, stop before that point.  Repeat __10__ times per set. Do __2__ sets per session. Do _1-2___ sessions per day.  http://orth.exer.us/295   Copyright  VHI. All rights reserved.   AROM: Lateral Neck Flexion    Slowly tilt head toward one shoulder, then the other. Hold each position __5__ seconds.  Don't push into pain, stop before that point Repeat __10__ times per set. Do _2_ sets per session. Do __1-2__ sessions per day.  http://orth.exer.us/297   Copyright  VHI. All rights reserved.   AROM: Neck Flexion    Bend head forward. Hold __5__ seconds. Repeat _10__ times per set. Do __2__ sets per session. Do __1-2__ sessions per day.  http://orth.exer.us/299   Copyright  VHI. All rights reserved.   AROM: Neck Extension    Bend head backward. Hold __5__ seconds. Don't push into too much pain, stop before that point.  Repeat __10__ times per set. Do __2__ sets per session. Do __1-2__ sessions per day.  http://orth.exer.us/301   Copyright  VHI. All rights reserved.

## 2015-05-22 ENCOUNTER — Ambulatory Visit: Payer: Medicaid Other | Admitting: Speech Pathology

## 2015-05-22 DIAGNOSIS — R41841 Cognitive communication deficit: Secondary | ICD-10-CM
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
## 2015-05-24 ENCOUNTER — Encounter (HOSPITAL_COMMUNITY): Payer: Self-pay | Admitting: *Deleted

## 2015-05-24 ENCOUNTER — Emergency Department (HOSPITAL_COMMUNITY): Payer: Medicaid Other

## 2015-05-24 ENCOUNTER — Emergency Department (HOSPITAL_COMMUNITY)
Admission: EM | Admit: 2015-05-24 | Discharge: 2015-05-24 | Disposition: A | Discharge status: Home / Self Care | Payer: Medicaid Other | Attending: Emergency Medicine | Admitting: Emergency Medicine

## 2015-05-24 DIAGNOSIS — R110 Nausea: Secondary | ICD-10-CM

## 2015-05-24 DIAGNOSIS — K828 Other specified diseases of gallbladder: Secondary | ICD-10-CM | POA: Insufficient documentation

## 2015-05-24 DIAGNOSIS — Z3202 Encounter for pregnancy test, result negative: Secondary | ICD-10-CM | POA: Insufficient documentation

## 2015-05-24 DIAGNOSIS — R1011 Right upper quadrant pain: Secondary | ICD-10-CM

## 2015-05-24 DIAGNOSIS — Z8781 Personal history of (healed) traumatic fracture: Secondary | ICD-10-CM | POA: Insufficient documentation

## 2015-05-24 DIAGNOSIS — R102 Pelvic and perineal pain: Secondary | ICD-10-CM | POA: Insufficient documentation

## 2015-05-24 DIAGNOSIS — Z79899 Other long term (current) drug therapy: Secondary | ICD-10-CM | POA: Insufficient documentation

## 2015-05-24 DIAGNOSIS — Z8632 Personal history of gestational diabetes: Secondary | ICD-10-CM | POA: Insufficient documentation

## 2015-05-24 LAB — COMPREHENSIVE METABOLIC PANEL
ALT: 16 U/L (ref 14–54)
ANION GAP: 11 (ref 5–15)
AST: 18 U/L (ref 15–41)
Albumin: 3.7 g/dL (ref 3.5–5.0)
Alkaline Phosphatase: 133 U/L — ABNORMAL HIGH (ref 38–126)
BUN: 6 mg/dL (ref 6–20)
CALCIUM: 9.4 mg/dL (ref 8.9–10.3)
CHLORIDE: 104 mmol/L (ref 101–111)
CO2: 25 mmol/L (ref 22–32)
CREATININE: 0.69 mg/dL (ref 0.44–1.00)
GFR calc Af Amer: >60 mL/min (ref >60)
GFR calc non Af Amer: >60 mL/min (ref >60)
GLUCOSE: 141 mg/dL — AB (ref 65–99)
POTASSIUM: 3.3 mmol/L — AB (ref 3.5–5.1)
Sodium: 140 mmol/L (ref 135–145)
Total Bilirubin: 0.1 mg/dL — ABNORMAL LOW (ref 0.3–1.2)
Total Protein: 7 g/dL (ref 6.5–8.1)

## 2015-05-24 LAB — CBC
HCT: 38.2 % (ref 36.0–46.0)
Hemoglobin: 13.1 g/dL (ref 12.0–15.0)
MCH: 31.9 pg (ref 26.0–34.0)
MCHC: 34.3 g/dL (ref 30.0–36.0)
MCV: 92.9 fL (ref 78.0–100.0)
PLATELETS: 330 10*3/uL (ref 150–400)
RBC: 4.11 MIL/uL (ref 3.87–5.11)
RDW: 14 % (ref 11.5–15.5)
WBC: 8.6 10*3/uL (ref 4.0–10.5)

## 2015-05-24 LAB — URINALYSIS, ROUTINE W REFLEX MICROSCOPIC
Bilirubin Urine: NEGATIVE
Glucose, UA: NEGATIVE mg/dL
HGB URINE DIPSTICK: NEGATIVE
KETONES UR: NEGATIVE mg/dL
Leukocytes, UA: NEGATIVE
Nitrite: NEGATIVE
PROTEIN: NEGATIVE mg/dL
Specific Gravity, Urine: 1.023 (ref 1.005–1.030)
pH: 5.5 (ref 5.0–8.0)

## 2015-05-24 LAB — LIPASE, BLOOD: LIPASE: 59 U/L — AB (ref 11–51)

## 2015-05-24 LAB — PREGNANCY, URINE: PREG TEST UR: NEGATIVE

## 2015-05-24 MED ORDER — ONDANSETRON HCL 4 MG/2ML IJ SOLN
4.0000 mg | Freq: Once | INTRAMUSCULAR | Status: AC
  Administered 2015-05-24: 4 mg via INTRAVENOUS
  Filled 2015-05-24: qty 2

## 2015-05-24 MED ORDER — SODIUM CHLORIDE 0.9 % IV BOLUS (SEPSIS)
1000.0000 mL | Freq: Once | INTRAVENOUS | Status: AC
  Administered 2015-05-24: 1000 mL via INTRAVENOUS

## 2015-05-24 MED ORDER — ONDANSETRON 4 MG PO TBDP: ORAL_TABLET | ORAL | Status: DC
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
## 2015-05-24 NOTE — Discharge Instructions (Signed)
If you were given medicines take as directed.  If you are on coumadin or contraceptives realize their levels and effectiveness is altered by many different medicines.  If you have any reaction (rash, tongues swelling, other) to the medicines stop taking and see a physician.    If your blood pressure was elevated in the ER make sure you follow up for management with a primary doctor or return for chest pain, shortness of breath or stroke symptoms.  Please follow up as directed and return to the ER or see a physician for new or worsening symptoms.  Thank you. Filed Vitals:   05/24/15 1839 05/24/15 2125 05/24/15 2200  BP: 129/79 117/79 120/79  Pulse: 97 68 67  Temp: 98.9 F (37.2 C)    TempSrc: Oral    Resp: 18 16   SpO2: 100% 100% 100%

## 2015-05-24 NOTE — ED Notes (Signed)
Spoke to tech in [place], pt next on list for imaging
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
## 2015-05-29 ENCOUNTER — Encounter: Payer: Self-pay | Admitting: Rehabilitation

## 2015-05-29 ENCOUNTER — Ambulatory Visit: Payer: Self-pay | Attending: General Surgery | Admitting: Rehabilitation

## 2015-05-29 DIAGNOSIS — R2681 Unsteadiness on feet: Secondary | ICD-10-CM | POA: Insufficient documentation

## 2015-05-29 DIAGNOSIS — M25559 Pain in unspecified hip: Secondary | ICD-10-CM | POA: Insufficient documentation

## 2015-05-29 DIAGNOSIS — Z7409 Other reduced mobility: Secondary | ICD-10-CM | POA: Insufficient documentation

## 2015-05-29 DIAGNOSIS — M436 Torticollis: Secondary | ICD-10-CM | POA: Insufficient documentation

## 2015-05-29 DIAGNOSIS — R6889 Other general symptoms and signs: Secondary | ICD-10-CM | POA: Insufficient documentation

## 2015-05-29 DIAGNOSIS — M542 Cervicalgia: Secondary | ICD-10-CM | POA: Insufficient documentation
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
## 2015-05-29 NOTE — Patient Instructions (Signed)
Straight Leg Raise    Contraiga los msculos del estomago y eleve lentamente la pierna derecha a ____ cm. del piso. Repita __10__ veces por rutina. Realice __1__ Carlis Stable por sesin. Realice __1-2__ sesiones por da.  Bracing With Bridging (Hook-Lying)    Con la espalda en posicin neutral. Tensione el piso plvico y abdominales y [place]. Eleve los glteos. Repita _10__ veces. Realice __1-2_ veces al da.  Copyright  VHI. All rights reserved.    http://orth.exer.us/1103   Copyright  VHI. All rights reserved.   Abduction: Clam (Eccentric) - Side-Lying    Acuestese de lado con las rodellas dobladas. Recoje la rodilla de ariba con los pies juntos. No se haga para los lados. . _10_veces, _1-2__ por dia, _5__ dias de la semana   http://ecce.exer.us/65   Copyright  VHI. All rights reserved.   Mini Squat: Double Leg    Con las piernas separadas al ancho de los hombros, inclnese hacia delante para mantener el equilibrio y Clinical biochemist una mini cuclilla. Mantenga las rodillas en lnea con el segundo dedo del pie. Las rodillas no deben sobrepasar los dedos del pie. Repita _10__ veces por rutina. Descanse _2-3__ segundos despus de cada rutina. Realice _1-2__ rutinas por sesin.  http://plyo.exer.us/70   Copyright  VHI. All rights reserved.   HIP / KNEE: Flexion - Standing    Raise knee to chest. Keep back straight. Perform slowly. _10__ reps per set, _1-2__ sets per day, _5__ days per week Hold onto a support.  Copyright  VHI. All rights reserved.   EXTENSION: Standing (Active)    Stand, both feet flat. Draw right leg behind body as far as possible. No weights on your legs! Complete _1__ sets of _10__ repetitions. Perform _1-2_ sessions per day.  http://gtsc.exer.us/77   Copyright  VHI. All rights reserved.

## 2015-06-03 ENCOUNTER — Telehealth (HOSPITAL_COMMUNITY): Payer: Self-pay | Admitting: Orthopedic Surgery

## 2015-06-03 NOTE — Telephone Encounter (Signed)
Needs f/u appt with Dr. Carola Frost

## 2015-06-03 NOTE — Telephone Encounter (Signed)
NA, unable to leave message

## 2015-06-06 ENCOUNTER — Ambulatory Visit: Payer: Self-pay | Admitting: Rehabilitation
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
## 2015-06-13 ENCOUNTER — Encounter: Payer: Self-pay | Admitting: Rehabilitation

## 2015-06-13 ENCOUNTER — Ambulatory Visit: Payer: Self-pay | Admitting: Rehabilitation

## 2015-06-13 DIAGNOSIS — M436 Torticollis: Secondary | ICD-10-CM

## 2015-06-13 DIAGNOSIS — R2681 Unsteadiness on feet: Secondary | ICD-10-CM

## 2015-06-13 DIAGNOSIS — M542 Cervicalgia: Secondary | ICD-10-CM

## 2015-06-13 NOTE — Patient Instructions (Signed)
   Start with a towel at the back of your head holding on with both hands. Next, rotate your head by pulling up on the left side of the towel as seen in the photo. This picture shows the head rotating to the right and pulling up on the left side.  Do slowly and just to the point that you can tolerate.  Do 10 times.     Place tennis balls (in sock) right behind your head under the bones that stick out behind your ears.  Just lie onto this and relax.  Hold for 5 mins.  Your daughter can assist by gently pulling backwards towards her for up to 1 min then let you relax again.
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
## 2015-06-20 ENCOUNTER — Ambulatory Visit: Payer: Self-pay | Admitting: Rehabilitation

## 2015-06-27 ENCOUNTER — Ambulatory Visit: Payer: Self-pay | Admitting: Rehabilitation

## 2015-07-04 ENCOUNTER — Ambulatory Visit: Payer: Self-pay | Admitting: Rehabilitation

## 2015-07-11 ENCOUNTER — Ambulatory Visit: Payer: Self-pay | Admitting: Rehabilitation

## 2015-07-18 ENCOUNTER — Ambulatory Visit: Payer: Self-pay | Admitting: Rehabilitation

## 2016-03-02 ENCOUNTER — Other Ambulatory Visit (HOSPITAL_COMMUNITY): Payer: Self-pay | Admitting: Nurse Practitioner

## 2016-03-02 DIAGNOSIS — Z1231 Encounter for screening mammogram for malignant neoplasm of breast: Secondary | ICD-10-CM

## 2016-03-19 ENCOUNTER — Other Ambulatory Visit (HOSPITAL_COMMUNITY): Payer: Self-pay | Admitting: Nurse Practitioner

## 2016-03-19 DIAGNOSIS — Z1231 Encounter for screening mammogram for malignant neoplasm of breast: Secondary | ICD-10-CM

## 2016-03-22 ENCOUNTER — Ambulatory Visit (HOSPITAL_COMMUNITY)
Admission: RE | Admit: 2016-03-22 | Discharge: 2016-03-22 | Disposition: A | Discharge status: Home / Self Care | Payer: Medicaid Other | Source: Ambulatory Visit | Attending: Nurse Practitioner | Admitting: Nurse Practitioner

## 2016-03-22 ENCOUNTER — Other Ambulatory Visit (HOSPITAL_COMMUNITY): Payer: Self-pay | Admitting: Nurse Practitioner

## 2016-03-22 ENCOUNTER — Encounter (HOSPITAL_COMMUNITY): Payer: Self-pay

## 2016-03-22 ENCOUNTER — Encounter: Payer: Self-pay | Admitting: Rehabilitation

## 2016-03-22 ENCOUNTER — Ambulatory Visit (HOSPITAL_COMMUNITY)
Admission: RE | Admit: 2016-03-22 | Discharge: 2016-03-22 | Disposition: A | Discharge status: Home / Self Care | Payer: Self-pay | Source: Ambulatory Visit | Attending: Nurse Practitioner | Admitting: Nurse Practitioner

## 2016-03-22 ENCOUNTER — Ambulatory Visit (HOSPITAL_COMMUNITY): Payer: Medicaid Other

## 2016-03-22 DIAGNOSIS — Z1231 Encounter for screening mammogram for malignant neoplasm of breast: Secondary | ICD-10-CM

## 2016-03-22 NOTE — Therapy (Signed)
[facility] [address] [place], [place], [address] Phone: [phone]   Fax:  [phone]  Patient Details  Name: Molly Herman MRN: [id number] Date of Birth: 01-16-1976 Referring Provider:  No ref. provider found  Encounter Date: 03/22/2016    PHYSICAL THERAPY DISCHARGE SUMMARY  Visits from Start of Care: 3  Current functional level related to goals / functional outcomes: Pt did not return to therapy   Remaining deficits:     PT Long Term Goals - 05/15/15 1256      PT LONG TERM GOAL #1   Title Pt will be independent with HEP to indicate decreased fall risk and improved functional mobility. (Target Date: 07/10/15)   Baseline dependent with HEP     PT LONG TERM GOAL #2   Title Pt will increase gait speed to 2.55 ft/sec without AD in order to indicate decreased fall risk and improved efficiency of gait.     Baseline 1.35 ft/sec with RW     PT LONG TERM GOAL #3   Title Pt will state no more than 4/10 pain during all functional mobility to indicate return to all activities with pain as decreased limiting factor.     Baseline 8/10 in back and neck     PT LONG TERM GOAL #4   Title Pt will ambulate >500' without AD at mod I level on varying outdoor surfaces in order to indicate safe return to community mobility.    Baseline 82' with RW on indoor surfaces.         Education / Equipment: HEP  Plan: Patient agrees to discharge.  Patient goals were not met. Patient is being discharged due to not returning since the last visit.  ?????        Cameron Sprang, PT, MPT [facility] [address] [place], [place], [address] Phone: [phone]   Fax:  [phone] 03/22/16, 1:12 PM

## 2017-09-01 ENCOUNTER — Ambulatory Visit (HOSPITAL_COMMUNITY)
Admission: RE | Admit: 2017-09-01 | Discharge: 2017-09-01 | Disposition: A | Discharge status: Home / Self Care | Payer: Self-pay | Source: Ambulatory Visit | Attending: *Deleted | Admitting: *Deleted

## 2017-09-01 ENCOUNTER — Other Ambulatory Visit (HOSPITAL_COMMUNITY): Payer: Self-pay | Admitting: *Deleted

## 2017-09-01 DIAGNOSIS — T1490XA Injury, unspecified, initial encounter: Secondary | ICD-10-CM

## 2017-09-01 DIAGNOSIS — S098XXA Other specified injuries of head, initial encounter: Secondary | ICD-10-CM | POA: Insufficient documentation
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
## 2018-09-29 ENCOUNTER — Other Ambulatory Visit (HOSPITAL_COMMUNITY): Payer: Self-pay | Admitting: Nurse Practitioner

## 2018-09-29 DIAGNOSIS — Z1231 Encounter for screening mammogram for malignant neoplasm of breast: Secondary | ICD-10-CM

## 2018-10-16 ENCOUNTER — Ambulatory Visit (HOSPITAL_COMMUNITY)
Admission: RE | Admit: 2018-10-16 | Discharge: 2018-10-16 | Disposition: A | Discharge status: Home / Self Care | Payer: Self-pay | Source: Ambulatory Visit | Attending: Nurse Practitioner | Admitting: Nurse Practitioner

## 2018-10-16 ENCOUNTER — Other Ambulatory Visit: Payer: Self-pay

## 2018-10-16 DIAGNOSIS — Z1231 Encounter for screening mammogram for malignant neoplasm of breast: Secondary | ICD-10-CM | POA: Insufficient documentation

## 2018-11-07 ENCOUNTER — Other Ambulatory Visit (HOSPITAL_COMMUNITY)
Admission: RE | Admit: 2018-11-07 | Discharge: 2018-11-07 | Disposition: A | Discharge status: Home / Self Care | Payer: Self-pay | Source: Ambulatory Visit | Attending: Nurse Practitioner | Admitting: Nurse Practitioner

## 2018-11-07 DIAGNOSIS — R87610 Atypical squamous cells of undetermined significance on cytologic smear of cervix (ASC-US): Secondary | ICD-10-CM | POA: Insufficient documentation

## 2018-11-07 DIAGNOSIS — R87810 Cervical high risk human papillomavirus (HPV) DNA test positive: Secondary | ICD-10-CM | POA: Insufficient documentation

## 2018-11-09 ENCOUNTER — Other Ambulatory Visit: Payer: Self-pay | Admitting: Nurse Practitioner

## 2019-04-15 ENCOUNTER — Encounter (HOSPITAL_COMMUNITY): Payer: Self-pay | Admitting: Emergency Medicine

## 2019-04-15 ENCOUNTER — Emergency Department (HOSPITAL_COMMUNITY)
Admission: EM | Admit: 2019-04-15 | Discharge: 2019-04-15 | Disposition: A | Discharge status: Home / Self Care | Payer: Self-pay | Attending: Emergency Medicine | Admitting: Emergency Medicine

## 2019-04-15 ENCOUNTER — Emergency Department (HOSPITAL_COMMUNITY): Payer: Self-pay

## 2019-04-15 DIAGNOSIS — J069 Acute upper respiratory infection, unspecified: Secondary | ICD-10-CM | POA: Insufficient documentation

## 2019-04-15 DIAGNOSIS — E119 Type 2 diabetes mellitus without complications: Secondary | ICD-10-CM | POA: Insufficient documentation

## 2019-04-15 LAB — COMPREHENSIVE METABOLIC PANEL
ALT: 51 U/L — ABNORMAL HIGH (ref 0–44)
AST: 71 U/L — ABNORMAL HIGH (ref 15–41)
Albumin: 3.4 g/dL — ABNORMAL LOW (ref 3.5–5.0)
Alkaline Phosphatase: 75 U/L (ref 38–126)
Anion gap: 10 (ref 5–15)
BUN: 6 mg/dL (ref 6–20)
CO2: 20 mmol/L — ABNORMAL LOW (ref 22–32)
Calcium: 8.5 mg/dL — ABNORMAL LOW (ref 8.9–10.3)
Chloride: 109 mmol/L (ref 98–111)
Creatinine, Ser: 0.67 mg/dL (ref 0.44–1.00)
GFR calc Af Amer: >60 mL/min (ref >60)
GFR calc non Af Amer: >60 mL/min (ref >60)
Glucose, Bld: 113 mg/dL — ABNORMAL HIGH (ref 70–99)
Potassium: 3.8 mmol/L (ref 3.5–5.1)
Sodium: 139 mmol/L (ref 135–145)
Total Bilirubin: 0.4 mg/dL (ref 0.3–1.2)
Total Protein: 6.8 g/dL (ref 6.5–8.1)

## 2019-04-15 LAB — CBC WITH DIFFERENTIAL/PLATELET
Abs Immature Granulocytes: 0.01 10*3/uL (ref 0.00–0.07)
Basophils Absolute: 0 10*3/uL (ref 0.0–0.1)
Basophils Relative: 0 %
Eosinophils Absolute: 0 10*3/uL (ref 0.0–0.5)
Eosinophils Relative: 0 %
HCT: 44.1 % (ref 36.0–46.0)
Hemoglobin: 14.9 g/dL (ref 12.0–15.0)
Immature Granulocytes: 0 %
Lymphocytes Relative: 27 %
Lymphs Abs: 1 10*3/uL (ref 0.7–4.0)
MCH: 31.4 pg (ref 26.0–34.0)
MCHC: 33.8 g/dL (ref 30.0–36.0)
MCV: 93 fL (ref 80.0–100.0)
Monocytes Absolute: 0.2 10*3/uL (ref 0.1–1.0)
Monocytes Relative: 7 %
Neutro Abs: 2.4 10*3/uL (ref 1.7–7.7)
Neutrophils Relative %: 66 %
Platelets: 163 10*3/uL (ref 150–400)
RBC: 4.74 MIL/uL (ref 3.87–5.11)
RDW: 12.6 % (ref 11.5–15.5)
WBC: 3.7 10*3/uL — ABNORMAL LOW (ref 4.0–10.5)
nRBC: 0 % (ref 0.0–0.2)

## 2019-04-15 LAB — I-STAT BETA HCG BLOOD, ED (MC, WL, AP ONLY): I-stat hCG, quantitative: <5 m[IU]/mL (ref <5)

## 2019-04-15 MED ORDER — KETOROLAC TROMETHAMINE 30 MG/ML IJ SOLN
15.0000 mg | Freq: Once | INTRAMUSCULAR | Status: AC
  Administered 2019-04-15: 09:00:00 15 mg via INTRAVENOUS
  Filled 2019-04-15: qty 1

## 2019-04-15 MED ORDER — BENZONATATE 100 MG PO CAPS: 100.0000 mg | ORAL_CAPSULE | Freq: Three times a day (TID) | ORAL | 0 refills | Status: DC

## 2019-04-15 MED ORDER — ALBUTEROL SULFATE HFA 108 (90 BASE) MCG/ACT IN AERS
2.0000 | INHALATION_SPRAY | Freq: Four times a day (QID) | RESPIRATORY_TRACT | Status: DC
  Administered 2019-04-15: 2 via RESPIRATORY_TRACT
  Filled 2019-04-15: qty 6.7

## 2019-04-15 NOTE — Discharge Instructions (Addendum)
Su enfermedad es ms consistente con el coronavirus. El resultado de su prueba para [employer] este diagnstico debera estar disponible maana.  Para sentirse mejor, use el inhalador provisto, hasta cada cuatro horas. El medicamento para la tos recetado tambin ser beneficioso.  Regrese aqu si se siente peor.
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
## 2019-04-15 NOTE — ED Notes (Signed)
ED Provider at bedside. 

## 2019-04-15 NOTE — ED Triage Notes (Addendum)
Pt arrives to ED with chief compliant of cough with chills and body aches that started on Friday. Pt had Covid swab Friday that has not resulted yet. Pt also having chest pain in center of chest.

## 2019-04-18 ENCOUNTER — Inpatient Hospital Stay (HOSPITAL_COMMUNITY)
Admission: EM | Admit: 2019-04-18 | Discharge: 2019-04-22 | DRG: 177 | Disposition: A | Discharge status: Home / Self Care | Payer: HRSA Program | Attending: Internal Medicine | Admitting: Internal Medicine

## 2019-04-18 ENCOUNTER — Emergency Department (HOSPITAL_COMMUNITY): Payer: HRSA Program

## 2019-04-18 ENCOUNTER — Other Ambulatory Visit: Payer: Self-pay

## 2019-04-18 ENCOUNTER — Encounter (HOSPITAL_COMMUNITY): Payer: Self-pay | Admitting: Emergency Medicine

## 2019-04-18 DIAGNOSIS — K219 Gastro-esophageal reflux disease without esophagitis: Secondary | ICD-10-CM | POA: Diagnosis not present

## 2019-04-18 DIAGNOSIS — U071 COVID-19: Secondary | ICD-10-CM | POA: Diagnosis present

## 2019-04-18 DIAGNOSIS — J189 Pneumonia, unspecified organism: Secondary | ICD-10-CM | POA: Diagnosis present

## 2019-04-18 DIAGNOSIS — Z23 Encounter for immunization: Secondary | ICD-10-CM

## 2019-04-18 DIAGNOSIS — J9601 Acute respiratory failure with hypoxia: Secondary | ICD-10-CM

## 2019-04-18 DIAGNOSIS — Z791 Long term (current) use of non-steroidal anti-inflammatories (NSAID): Secondary | ICD-10-CM

## 2019-04-18 DIAGNOSIS — J1282 Pneumonia due to coronavirus disease 2019: Secondary | ICD-10-CM | POA: Diagnosis present

## 2019-04-18 DIAGNOSIS — Z79899 Other long term (current) drug therapy: Secondary | ICD-10-CM | POA: Diagnosis not present

## 2019-04-18 DIAGNOSIS — J1289 Other viral pneumonia: Secondary | ICD-10-CM | POA: Diagnosis present

## 2019-04-18 DIAGNOSIS — Z87820 Personal history of traumatic brain injury: Secondary | ICD-10-CM

## 2019-04-18 DIAGNOSIS — E119 Type 2 diabetes mellitus without complications: Secondary | ICD-10-CM | POA: Diagnosis present

## 2019-04-18 DIAGNOSIS — Z8249 Family history of ischemic heart disease and other diseases of the circulatory system: Secondary | ICD-10-CM

## 2019-04-18 DIAGNOSIS — Z8632 Personal history of gestational diabetes: Secondary | ICD-10-CM | POA: Diagnosis not present

## 2019-04-18 LAB — COMPREHENSIVE METABOLIC PANEL
ALT: 45 U/L — ABNORMAL HIGH (ref 0–44)
AST: 44 U/L — ABNORMAL HIGH (ref 15–41)
Albumin: 3.6 g/dL (ref 3.5–5.0)
Alkaline Phosphatase: 81 U/L (ref 38–126)
Anion gap: 9 (ref 5–15)
BUN: 10 mg/dL (ref 6–20)
CO2: 24 mmol/L (ref 22–32)
Calcium: 8.7 mg/dL — ABNORMAL LOW (ref 8.9–10.3)
Chloride: 103 mmol/L (ref 98–111)
Creatinine, Ser: 0.72 mg/dL (ref 0.44–1.00)
GFR calc Af Amer: >60 mL/min (ref >60)
GFR calc non Af Amer: >60 mL/min (ref >60)
Glucose, Bld: 98 mg/dL (ref 70–99)
Potassium: 3.8 mmol/L (ref 3.5–5.1)
Sodium: 136 mmol/L (ref 135–145)
Total Bilirubin: 0.6 mg/dL (ref 0.3–1.2)
Total Protein: 7.5 g/dL (ref 6.5–8.1)

## 2019-04-18 LAB — CBC WITH DIFFERENTIAL/PLATELET
Abs Immature Granulocytes: 0.01 10*3/uL (ref 0.00–0.07)
Basophils Absolute: 0 10*3/uL (ref 0.0–0.1)
Basophils Relative: 0 %
Eosinophils Absolute: 0 10*3/uL (ref 0.0–0.5)
Eosinophils Relative: 1 %
HCT: 44.7 % (ref 36.0–46.0)
Hemoglobin: 14.9 g/dL (ref 12.0–15.0)
Immature Granulocytes: 0 %
Lymphocytes Relative: 36 %
Lymphs Abs: 1.4 10*3/uL (ref 0.7–4.0)
MCH: 31.3 pg (ref 26.0–34.0)
MCHC: 33.3 g/dL (ref 30.0–36.0)
MCV: 93.9 fL (ref 80.0–100.0)
Monocytes Absolute: 0.5 10*3/uL (ref 0.1–1.0)
Monocytes Relative: 12 %
Neutro Abs: 2 10*3/uL (ref 1.7–7.7)
Neutrophils Relative %: 51 %
Platelets: 258 10*3/uL (ref 150–400)
RBC: 4.76 MIL/uL (ref 3.87–5.11)
RDW: 12.7 % (ref 11.5–15.5)
WBC: 4 10*3/uL (ref 4.0–10.5)
nRBC: 0 % (ref 0.0–0.2)

## 2019-04-18 LAB — TRIGLYCERIDES: Triglycerides: 217 mg/dL — ABNORMAL HIGH (ref <150)

## 2019-04-18 LAB — I-STAT BETA HCG BLOOD, ED (MC, WL, AP ONLY): I-stat hCG, quantitative: <5 m[IU]/mL (ref <5)

## 2019-04-18 LAB — RESPIRATORY PANEL BY RT PCR (FLU A&B, COVID)
Influenza A by PCR: NEGATIVE
Influenza B by PCR: NEGATIVE
SARS Coronavirus 2 by RT PCR: POSITIVE — AB

## 2019-04-18 LAB — D-DIMER, QUANTITATIVE: D-Dimer, Quant: 0.37 ug/mL-FEU (ref 0.00–0.50)

## 2019-04-18 LAB — LACTATE DEHYDROGENASE: LDH: 280 U/L — ABNORMAL HIGH (ref 98–192)

## 2019-04-18 LAB — HCG, QUANTITATIVE, PREGNANCY: hCG, Beta Chain, Quant, S: <1 m[IU]/mL (ref <5)

## 2019-04-18 LAB — PROCALCITONIN: Procalcitonin: <0.1 ng/mL

## 2019-04-18 LAB — POC SARS CORONAVIRUS 2 AG -  ED: SARS Coronavirus 2 Ag: NEGATIVE

## 2019-04-18 LAB — FIBRINOGEN: Fibrinogen: >800 mg/dL — ABNORMAL HIGH (ref 210–475)

## 2019-04-18 LAB — FERRITIN: Ferritin: 326 ng/mL — ABNORMAL HIGH (ref 11–307)

## 2019-04-18 LAB — GLUCOSE, CAPILLARY: Glucose-Capillary: 155 mg/dL — ABNORMAL HIGH (ref 70–99)

## 2019-04-18 LAB — C-REACTIVE PROTEIN: CRP: 6.4 mg/dL — ABNORMAL HIGH (ref <1.0)

## 2019-04-18 LAB — LACTIC ACID, PLASMA: Lactic Acid, Venous: 0.9 mmol/L (ref 0.5–1.9)

## 2019-04-18 MED ORDER — ACETAMINOPHEN 325 MG PO TABS
650.0000 mg | ORAL_TABLET | Freq: Once | ORAL | Status: AC
  Administered 2019-04-18: 650 mg via ORAL
  Filled 2019-04-18: qty 2

## 2019-04-18 MED ORDER — DEXAMETHASONE SODIUM PHOSPHATE 10 MG/ML IJ SOLN
8.0000 mg | Freq: Once | INTRAMUSCULAR | Status: AC
  Administered 2019-04-18: 8 mg via INTRAVENOUS
  Filled 2019-04-18: qty 1

## 2019-04-18 MED ORDER — AEROCHAMBER Z-STAT PLUS/MEDIUM MISC
Status: AC
  Administered 2019-04-18: 1
  Filled 2019-04-18: qty 1

## 2019-04-18 MED ORDER — SODIUM CHLORIDE 0.9 % IV SOLN
100.0000 mg | Freq: Every day | INTRAVENOUS | Status: AC
  Administered 2019-04-19 – 2019-04-22 (×4): 100 mg via INTRAVENOUS
  Filled 2019-04-18 (×4): qty 20

## 2019-04-18 MED ORDER — DM-GUAIFENESIN ER 30-600 MG PO TB12
1.0000 | ORAL_TABLET | Freq: Two times a day (BID) | ORAL | Status: DC
  Administered 2019-04-18 – 2019-04-22 (×8): 1 via ORAL
  Filled 2019-04-18 (×9): qty 1

## 2019-04-18 MED ORDER — SODIUM CHLORIDE 0.9 % IV SOLN
200.0000 mg | Freq: Once | INTRAVENOUS | Status: AC
  Administered 2019-04-18: 200 mg via INTRAVENOUS
  Filled 2019-04-18: qty 40

## 2019-04-18 MED ORDER — DEXAMETHASONE SODIUM PHOSPHATE 10 MG/ML IJ SOLN
6.0000 mg | INTRAMUSCULAR | Status: DC
  Administered 2019-04-19 – 2019-04-22 (×4): 6 mg via INTRAVENOUS
  Filled 2019-04-18 (×4): qty 1

## 2019-04-18 MED ORDER — ENOXAPARIN SODIUM 40 MG/0.4ML ~~LOC~~ SOLN
40.0000 mg | Freq: Every day | SUBCUTANEOUS | Status: DC
  Administered 2019-04-18 – 2019-04-21 (×4): 40 mg via SUBCUTANEOUS
  Filled 2019-04-18 (×4): qty 0.4

## 2019-04-18 MED ORDER — ONDANSETRON HCL 4 MG PO TABS: 4.0000 mg | ORAL_TABLET | Freq: Four times a day (QID) | ORAL | Status: DC | PRN

## 2019-04-18 MED ORDER — ALBUTEROL SULFATE HFA 108 (90 BASE) MCG/ACT IN AERS: 4.0000 | INHALATION_SPRAY | RESPIRATORY_TRACT | Status: DC | PRN

## 2019-04-18 MED ORDER — DEXAMETHASONE SODIUM PHOSPHATE 10 MG/ML IJ SOLN: 10.0000 mg | Freq: Once | INTRAMUSCULAR | Status: DC

## 2019-04-18 MED ORDER — ALBUTEROL SULFATE HFA 108 (90 BASE) MCG/ACT IN AERS
4.0000 | INHALATION_SPRAY | Freq: Once | RESPIRATORY_TRACT | Status: AC
  Administered 2019-04-18: 4 via RESPIRATORY_TRACT
  Filled 2019-04-18: qty 6.7

## 2019-04-18 MED ORDER — POLYETHYLENE GLYCOL 3350 17 G PO PACK: 17.0000 g | PACK | Freq: Every day | ORAL | Status: DC | PRN

## 2019-04-18 MED ORDER — ONDANSETRON HCL 4 MG/2ML IJ SOLN
4.0000 mg | Freq: Four times a day (QID) | INTRAMUSCULAR | Status: DC | PRN
  Administered 2019-04-22: 4 mg via INTRAVENOUS
  Filled 2019-04-18: qty 2

## 2019-04-18 NOTE — ED Notes (Signed)
Pt was given dinner tray.  

## 2019-04-18 NOTE — ED Notes (Signed)
CRITICAL VALUE ALERT  Critical Value:  COVID positive  Date & Time Notied:  04/18/2019 @ 9179  Provider Notified: Dr Denton Brick  Orders Received/Actions taken: see new orders.

## 2019-04-18 NOTE — Progress Notes (Signed)
Pharmacy Consult - Remdesivir  28 yof presenting COVID-19 positive with respiratory symptoms requiring hospitalization. Pharmacy consulted to dose Remdesivir. ALT 45 today.   Plan: Remdesivir 200mg  IV x 1; then 100mg  IV q24h to complete 5 total doses Monitor clinical progress, ALT   Antonietta Jewel, PharmD, BCCCP Clinical Pharmacist   Please check AMION for all Pharmacy phone number 04/18/2019 5:21 PM

## 2019-04-18 NOTE — H&P (Signed)
History and Physical    Molly Herman UUV:[id number] DOB: [birth year] DOA: 04/18/2019  PCP: Patient, No Pcp Per   Patient coming from: Home  I have personally briefly reviewed patient's old medical records in Portal  Chief Complaint: Cough, difficulty breathing  HPI: Molly Herman is a 43 y.o. female with medical history significant for gestational diabetes mellitus, traumatic brain injury.  Patient is Spanish-speaking with limited English but able to respond to simple questions.  Presented to the ED with complaints of cough and difficulty breathing, with fevers at home for the past week.  Patient was seen in the ED 12/20 for similar symptoms, she had had a Covid test done 2 days prior, she was still awaiting the results.  Patient reports multiple coworkers have Covid 19 infection.  ED Course: Sats 91 to 93% on room air.  Initial tachycardia 208.  D-dimer normal, but elevation in other inflammatory markers.  Portable chest x-ray shows lateral airspace opacities worsened compared to the earlier chest x-ray 04/15/2019 most likely multifocal pneumonia.  Procalcitonin less than 0.1.  Point-of-care Covid test negative, PCR test ordered and pending.  Hospitalist to admit for pneumonia.  Review of Systems: As per HPI all other systems reviewed and negative.  Past Medical History:  Diagnosis Date  . Diabetes mellitus without complication ([place])    Gestational w/ last pregnancy    History reviewed. No pertinent surgical history.   reports that she has never smoked. She has never used smokeless tobacco. She reports that she does not drink alcohol or use drugs.  No Known Allergies  Family history of hypertension.  Prior to Admission medications   Medication Sig Start Date End Date Taking? Authorizing Provider  acetaminophen (TYLENOL) 325 MG tablet Take 1-2 tablets (325-650 mg total) by mouth every 6 (six) hours as needed for fever, headache, mild pain or moderate  pain. 05/12/15  Yes Randon Goldsmith, Megan N, PA-C  benzonatate (TESSALON) 100 MG capsule Take 1 capsule (100 mg total) by mouth every 8 (eight) hours. 04/15/19  Yes Carmin Muskrat, MD  ibuprofen (ADVIL) 200 MG tablet Take 200 mg by mouth every 6 (six) hours as needed for fever or headache.   Yes [provider]  docusate sodium (COLACE) 100 MG capsule Take 1 capsule (100 mg total) by mouth 2 (two) times daily. Patient not taking: Reported on 04/15/2019 05/12/15   Nat Christen, PA-C  feeding supplement, GLUCERNA SHAKE, (GLUCERNA SHAKE) LIQD Take 237 mLs by mouth 3 (three) times daily between meals. Patient not taking: Reported on 04/15/2019 05/12/15   Nat Christen, PA-C  methocarbamol (ROBAXIN) 500 MG tablet Take 2 tablets (1,000 mg total) by mouth every 8 (eight) hours as needed for muscle spasms. Patient not taking: Reported on 04/15/2019 05/12/15   Nat Christen, PA-C  ondansetron (ZOFRAN ODT) 4 MG disintegrating tablet 4mg  ODT q4 hours prn nausea/vomit Patient not taking: Reported on 04/15/2019 05/24/15   Elnora Morrison, MD  oxyCODONE (OXY IR/ROXICODONE) 5 MG immediate release tablet Take 1-3 tablets (5-15 mg total) by mouth every 4 (four) hours as needed for moderate pain. Patient not taking: Reported on 04/15/2019 05/12/15   Nat Christen, PA-C  polyethylene glycol [facility] / GLYCOLAX) packet Take 17 g by mouth daily. Patient not taking: Reported on 04/15/2019 05/12/15   Nat Christen, PA-C  traMADol (ULTRAM) 50 MG tablet Take 1-2 tablets (50-100 mg total) by mouth every 6 (six) hours as needed. Patient not taking: Reported on 04/15/2019 05/12/15  Nonie HoyerBaird, Megan N, [place]PA-C    Physical Exam: Vitals:   04/18/19 1445 04/18/19 1500 04/18/19 1515 04/18/19 1530  BP:  114/85  117/84  Pulse: 96 96 92 94  Resp:    16  Temp:      TempSrc:      SpO2: 100% 100% 100% 100%    Constitutional: NAD, calm, comfortable Vitals:   04/18/19 1445 04/18/19 1500 04/18/19 1515 04/18/19 1530  BP:  114/85   117/84  Pulse: 96 96 92 94  Resp:    16  Temp:      TempSrc:      SpO2: 100% 100% 100% 100%   Eyes: PERRL, lids and conjunctivae normal ENMT: Mucous membranes are moist. Posterior pharynx clear of any exudate or lesions. Neck: normal, supple, no masses, no thyromegaly Respiratory:  Normal respiratory effort. No accessory muscle use.  Cardiovascular: Regular rate and rhythm, No extremity edema. 2+ pedal pulses. No carotid bruits.  Abdomen: no tenderness, no masses palpated. No hepatosplenomegaly. Bowel sounds positive.  Musculoskeletal: no clubbing / cyanosis. No joint deformity upper and lower extremities. Good ROM, no contractures. Normal muscle tone.  Skin: no rashes, lesions, ulcers. No induration Neurologic: CN 2-12 grossly intact.  Strength 5/5 in all 4.  Psychiatric: Normal judgment and insight. Alert and oriented x 3. Normal mood.   Labs on Admission: I have personally reviewed following labs and imaging studies  CBC: Recent Labs  Lab 04/15/19 0842 04/18/19 1238  WBC 3.7* 4.0  NEUTROABS 2.4 2.0  HGB 14.9 14.9  HCT 44.1 44.7  MCV 93.0 93.9  PLT 163 258   Basic Metabolic Panel: Recent Labs  Lab 04/15/19 0842 04/18/19 1238  NA 139 136  K 3.8 3.8  CL 109 103  CO2 20* 24  GLUCOSE 113* 98  BUN 6 10  CREATININE 0.67 0.72  CALCIUM 8.5* 8.7*   GFR: CrCl cannot be calculated (Unknown ideal weight.). Liver Function Tests: Recent Labs  Lab 04/15/19 0842 04/18/19 1238  AST 71* 44*  ALT 51* 45*  ALKPHOS 75 81  BILITOT 0.4 0.6  PROT 6.8 7.5  ALBUMIN 3.4* 3.6   Lipid Profile: Recent Labs    04/18/19 1238  TRIG 217*   Anemia Panel: Recent Labs    04/18/19 1238  FERRITIN 326*    Radiological Exams on Admission: DG Chest Port 1 View  Result Date: 04/18/2019 CLINICAL DATA:  Shortness of breath, possible COVID-19 infection. EXAM: PORTABLE CHEST 1 VIEW COMPARISON:  Chest x-rays dated 04/15/2019 and 05/01/2015. FINDINGS: Patchy bilateral airspace  opacities, worsened compared to the earlier chest x-ray of 04/15/2019. No pleural effusion or pneumothorax is seen. Heart size and mediastinal contours are within normal limits. Osseous structures about the chest are unremarkable. IMPRESSION: Patchy bilateral airspace opacities, worsened compared to the earlier chest x-ray of 04/15/2019, most likely multifocal pneumonia. Electronically Signed   By: Bary RichardStan  Maynard M.D.   On: 04/18/2019 13:04    EKG: Independently reviewed.  Sinus tachycardia.  QTc 399.  No significant change from prior EKG.  Assessment/Plan Active Problems:   Pneumonia  COVID-19 pneumonia-dyspnea, cough.  O2 sats > 91% on room air.  Currently on 2 L O2 . WBC 4.  Pro-Calcitonin less than 0.1.  D-dimer WNL, elevation in some inflammatory markers.  Portable chest x-ray shows patchy bilateral airspace opacities worsened compared to earlier chest x-ray 06/15/2018 most likely multifocal pneumonia.  -Dexamethasone 8 mg x 1 given in ED, continue 60 mg daily -Remdesivir pharmacy to dose -Daily inflammatory  marker -CBC, CMP daily -Covid-19 admission protocol -Mucolytic's,, supplemental O2, as needed albuterol inhaler -Daily fasting blood glucose checks while on steroids, considering history of gestational diabetes.  DVT prophylaxis: Lovenox Code Status: Full Code Family Communication: None at bedside Disposition Plan: > 2 days Consults called: None Admission status: Obs, tele  I certify that at the point of admission it is my clinical judgment that the patient will require inpatient hospital care spanning beyond 2 midnights from the point of admission due to high intensity of service, high risk for further deterioration and high frequency of surveillance required. The following factors support the patient status of inpatient: COVID-19 pneumonia requiring steroids and intravenous antivirals hospitalized.   Onnie Boer MD [facility]  04/18/2019, 5:40 PM

## 2019-04-18 NOTE — ED Provider Notes (Signed)
Medical screening examination/treatment/procedure(s) were conducted as a shared visit with non-physician practitioner(s) and myself.  I personally evaluated the patient during the encounter.  Clinical Impression:   Final diagnoses:  Acute respiratory failure with hypoxia ([place])  COVID-19 virus infection    This patient is a 43 year old female, she presents with acute respiratory distress with increasing work of breathing, tachycardia and has rales on my exam.  She has no significant edema of her legs but has increasing work of breathing, speaks in 2-3 word sentences, using some accessory muscles and tachypneic.  Oxygen level ranges between 90 and 100% depending on effort however she takes small breaths stating that it hurts when she takes big breaths and has rales bilaterally.  The patient is Spanish-speaking only  Her chest x-ray reveals multifocal patchy infiltrates consistent with coronavirus.  The patient will likely need to be admitted for respiratory distress.  Critical care provided for acute hypoxic respiratory failure.  covid test is positive - needs admission   Noemi Chapel, MD 04/19/19 [phone]

## 2019-04-18 NOTE — ED Notes (Signed)
ED TO INPATIENT HANDOFF REPORT  ED Nurse Name and Phone #: [phone]  S Name/Age/Gender Molly Herman 43 y.o. female Room/Bed: APA05/APA05  Code Status   Code Status: Prior  Home/SNF/Other Home Patient oriented to: situation Is this baseline? Yes   Triage Complete: Triage complete  Chief Complaint Pneumonia [J18.9] Pneumonia due to COVID-19 virus [U07.1, J12.89]  Triage Note Patient reports cough, was seen at [facility] on Friday and swabbed for COVID, results not back yet.     Allergies No Known Allergies  Level of Care/Admitting Diagnosis ED Disposition    ED Disposition Condition [facility] Area: [facility] [[id number]]  Level of Care: Telemetry [5]  Covid Evaluation: Confirmed COVID Positive  Diagnosis: Pneumonia due to COVID-19 virus [[id number]]  Admitting Physician: Bethena Roys [[id number]]  Attending Physician: Bethena Roys [phone]  Estimated length of stay: past midnight tomorrow  Certification:: I certify this patient will need inpatient services for at least 2 midnights       B Medical/Surgery History Past Medical History:  Diagnosis Date  . Diabetes mellitus without complication ([place])    Gestational w/ last pregnancy   History reviewed. No pertinent surgical history.   A IV Location/Drains/Wounds Patient Lines/Drains/Airways Status   Active Line/Drains/Airways    Name:   Placement date:   Placement time:   Site:   Days:   Peripheral IV 04/18/19 Right Antecubital   04/18/19    1332    Antecubital   less than 1          Intake/Output Last 24 hours No intake or output data in the 24 hours ending 04/18/19 2048  Labs/Imaging Results for orders placed or performed during the hospital encounter of 04/18/19 (from the past 48 hour(s))  Lactic acid, plasma     Status: None   Collection Time: 04/18/19 12:38 PM  Result Value Ref Range   Lactic Acid, Venous 0.9 0.5 - 1.9 mmol/L    Comment: Performed at  [facility], [address], [place], [place] [address]  CBC WITH DIFFERENTIAL     Status: None   Collection Time: 04/18/19 12:38 PM  Result Value Ref Range   WBC 4.0 4.0 - 10.5 K/uL   RBC 4.76 3.87 - 5.11 MIL/uL   Hemoglobin 14.9 12.0 - 15.0 g/dL   HCT 44.7 36.0 - 46.0 %   MCV 93.9 80.0 - 100.0 fL   MCH 31.3 26.0 - 34.0 pg   MCHC 33.3 30.0 - 36.0 g/dL   RDW 12.7 11.5 - 15.5 %   Platelets 258 150 - 400 K/uL   nRBC 0.0 0.0 - 0.2 %   Neutrophils Relative % 51 %   Neutro Abs 2.0 1.7 - 7.7 K/uL   Lymphocytes Relative 36 %   Lymphs Abs 1.4 0.7 - 4.0 K/uL   Monocytes Relative 12 %   Monocytes Absolute 0.5 0.1 - 1.0 K/uL   Eosinophils Relative 1 %   Eosinophils Absolute 0.0 0.0 - 0.5 K/uL   Basophils Relative 0 %   Basophils Absolute 0.0 0.0 - 0.1 K/uL   Immature Granulocytes 0 %   Abs Immature Granulocytes 0.01 0.00 - 0.07 K/uL   Reactive, Benign Lymphocytes PRESENT     Comment: Performed at [facility], [address] [place], [place] [address]  Comprehensive metabolic panel     Status: Abnormal   Collection Time: 04/18/19 12:38 PM  Result Value Ref Range   Sodium 136 135 - 145 mmol/L  Potassium 3.8 3.5 - 5.1 mmol/L   Chloride 103 98 - 111 mmol/L   CO2 24 22 - 32 mmol/L   Glucose, Bld 98 70 - 99 mg/dL   BUN 10 6 - 20 mg/dL   Creatinine, Ser 0.72 0.44 - 1.00 mg/dL   Calcium 8.7 (L) 8.9 - 10.3 mg/dL   Total Protein 7.5 6.5 - 8.1 g/dL   Albumin 3.6 3.5 - 5.0 g/dL   AST 44 (H) 15 - 41 U/L   ALT 45 (H) 0 - 44 U/L   Alkaline Phosphatase 81 38 - 126 U/L   Total Bilirubin 0.6 0.3 - 1.2 mg/dL   GFR calc non Af Amer >60 >60 mL/min   GFR calc Af Amer >60 >60 mL/min   Anion gap 9 5 - 15    Comment: Performed at [facility], [address] [place], [place] [address]  D-dimer, quantitative     Status: None   Collection Time: 04/18/19 12:38 PM  Result Value Ref Range   D-Dimer, Quant 0.37 0.00 - 0.50 ug/mL-FEU    Comment: (NOTE) At the manufacturer cut-off of 0.50 ug/mL FEU, this  assay has been documented to exclude PE with a sensitivity and negative predictive value of 97 to 99%.  At this time, this assay has not been approved by the FDA to exclude DVT/VTE. Results should be correlated with clinical presentation. Performed at [facility], [address], [place], [place] [address]   Procalcitonin     Status: None   Collection Time: 04/18/19 12:38 PM  Result Value Ref Range   Procalcitonin <0.10 ng/mL    Comment:        Interpretation: PCT (Procalcitonin) <= 0.5 ng/mL: Systemic infection (sepsis) is not likely. Local bacterial infection is possible. (NOTE)       Sepsis PCT Algorithm           Lower Respiratory Tract                                      Infection PCT Algorithm    ----------------------------     ----------------------------         PCT < 0.25 ng/mL                PCT < 0.10 ng/mL         Strongly encourage             Strongly discourage   discontinuation of antibiotics    initiation of antibiotics    ----------------------------     -----------------------------       PCT 0.25 - 0.50 ng/mL            PCT 0.10 - 0.25 ng/mL               OR       >80% decrease in PCT            Discourage initiation of                                            antibiotics      Encourage discontinuation           of antibiotics    ----------------------------     -----------------------------         PCT >= 0.50 ng/mL  PCT 0.26 - 0.50 ng/mL               AND        <80% decrease in PCT             Encourage initiation of                                             antibiotics       Encourage continuation           of antibiotics    ----------------------------     -----------------------------        PCT >= 0.50 ng/mL                  PCT > 0.50 ng/mL               AND         increase in PCT                  Strongly encourage                                      initiation of antibiotics    Strongly encourage escalation           of  antibiotics                                     -----------------------------                                           PCT <= 0.25 ng/mL                                                 OR                                        > 80% decrease in PCT                                     Discontinue / Do not initiate                                             antibiotics Performed at [facility], [address], [place], [place] [address]   Lactate dehydrogenase     Status: Abnormal   Collection Time: 04/18/19 12:38 PM  Result Value Ref Range   LDH 280 (H) 98 - 192 U/L    Comment: Performed at [facility], [address], [place], [place] [address]  Ferritin     Status: Abnormal   Collection Time: 04/18/19 12:38 PM  Result Value Ref Range   Ferritin 326 (H)  11 - 307 ng/mL    Comment: Performed at [facility], [address], [place], [place] [address]  Triglycerides     Status: Abnormal   Collection Time: 04/18/19 12:38 PM  Result Value Ref Range   Triglycerides 217 (H) <150 mg/dL    Comment: Performed at [facility], [address], [place], [place] [address]  Fibrinogen     Status: Abnormal   Collection Time: 04/18/19 12:38 PM  Result Value Ref Range   Fibrinogen >800 (H) 210 - 475 mg/dL    Comment: Performed at [facility], [address] [place], [place] [address]  C-reactive protein     Status: Abnormal   Collection Time: 04/18/19 12:38 PM  Result Value Ref Range   CRP 6.4 (H) <1.0 mg/dL    Comment: Performed at [facility], [address], [place], [place] [address]  Blood Culture (routine x 2)     Status: None (Preliminary result)   Collection Time: 04/18/19 12:43 PM   Specimen: Right Antecubital; Blood  Result Value Ref Range   Specimen Description RIGHT ANTECUBITAL    Special Requests      Blood Culture adequate volume BOTTLES DRAWN AEROBIC AND ANAEROBIC Performed at [facility], [address] [place], [place] [address]    Culture PENDING    Report Status PENDING    I-Stat Beta hCG blood, ED ([facility], [facility], [facility] only)     Status: None   Collection Time: 04/18/19  1:57 PM  Result Value Ref Range   I-stat hCG, quantitative <5.0 <5 mIU/mL   Comment 3            Comment:   GEST. AGE      CONC.  (mIU/mL)   <=1 WEEK        5 - 50     2 WEEKS       50 - 500     3 WEEKS       100 - 10,000     4 WEEKS     1,000 - 30,000        FEMALE AND NON-PREGNANT FEMALE:     LESS THAN 5 mIU/mL   Blood Culture (routine x 2)     Status: None (Preliminary result)   Collection Time: 04/18/19  2:03 PM   Specimen: Site Not Specified; Blood  Result Value Ref Range   Specimen Description SITE NOT SPECIFIED    Special Requests      BOTTLES DRAWN AEROBIC AND ANAEROBIC Blood Culture adequate volume Performed at [facility], [address], [place], [place] [address]    Culture PENDING    Report Status PENDING   hCG, quantitative, pregnancy     Status: None   Collection Time: 04/18/19  2:08 PM  Result Value Ref Range   hCG, Beta Chain, Quant, S <1 <5 mIU/mL    Comment:          GEST. AGE      CONC.  (mIU/mL)   <=1 WEEK        5 - 50     2 WEEKS       50 - 500     3 WEEKS       100 - 10,000     4 WEEKS     1,000 - 30,000     5 WEEKS     3,500 - 115,000   6-8 WEEKS     12,000 - 270,000    12 WEEKS  15,000 - 220,000        FEMALE AND NON-PREGNANT FEMALE:     LESS THAN 5 mIU/mL Performed at [facility], [address], [place],  [address]   POC SARS Coronavirus 2 Ag-ED - Nasal Swab (BD Veritor Kit)     Status: None   Collection Time: 04/18/19  4:02 PM  Result Value Ref Range   SARS Coronavirus 2 Ag NEGATIVE NEGATIVE    Comment: (NOTE) SARS-CoV-2 antigen NOT DETECTED.  Negative results are presumptive.  Negative results do not preclude SARS-CoV-2 infection and should not be used as the sole basis for treatment or other patient management decisions, including infection  control decisions, particularly in the presence of clinical signs and  symptoms consistent with  COVID-19, or in those who have been in contact with the virus.  Negative results must be combined with clinical observations, patient history, and epidemiological information. The expected result is Negative. Fact Sheet for Patients: PodPark.tn Fact Sheet for Healthcare Providers: GiftContent.is This test is not yet approved or cleared by the [place] FDA and  has been authorized for detection and/or diagnosis of SARS-CoV-2 by FDA under an Emergency Use Authorization (EUA).  This EUA will remain in effect (meaning this test can be used) for the duration of  the COVID-19 de claration under Section 564(b)(1) of the Act, 21 U.S.C. section 360bbb-3(b)(1), unless the authorization is terminated or revoked sooner.   Respiratory Panel by RT PCR (Flu A&B, Covid) - Nasopharyngeal Swab     Status: Abnormal   Collection Time: 04/18/19  4:11 PM   Specimen: Nasopharyngeal Swab  Result Value Ref Range   SARS Coronavirus 2 by RT PCR POSITIVE (A) NEGATIVE    Comment: RESULT CALLED TO, READ BACK BY AND VERIFIED WITH: MARTIN,D AT 1704 ON 12.23.20 BY ISLEY,B (NOTE) SARS-CoV-2 target nucleic acids are DETECTED. SARS-CoV-2 RNA is generally detectable in upper respiratory specimens  during the acute phase of infection. Positive results are indicative of the presence of the identified virus, but do not rule out bacterial infection or co-infection with other pathogens not detected by the test. Clinical correlation with patient history and other diagnostic information is necessary to determine patient infection status. The expected result is Negative. Fact Sheet for Patients:  PinkCheek.be Fact Sheet for Healthcare Providers: GravelBags.it This test is not yet approved or cleared by the [place] FDA and  has been authorized for detection and/or diagnosis of SARS-CoV-2 by FDA under  an Emergency Use Authorization (EUA).  This EUA will remain in effect (meaning this test can be use d) for the duration of  the COVID-19 declaration under Section 564(b)(1) of the Act, 21 U.S.C. section 360bbb-3(b)(1), unless the authorization is terminated or revoked sooner.    Influenza A by PCR NEGATIVE NEGATIVE   Influenza B by PCR NEGATIVE NEGATIVE    Comment: (NOTE) The Xpert Xpress SARS-CoV-2/FLU/RSV assay is intended as an aid in  the diagnosis of influenza from Nasopharyngeal swab specimens and  should not be used as a sole basis for treatment. Nasal washings and  aspirates are unacceptable for Xpert Xpress SARS-CoV-2/FLU/RSV  testing. Fact Sheet for Patients: PinkCheek.be Fact Sheet for Healthcare Providers: GravelBags.it This test is not yet approved or cleared by the [place] FDA and  has been authorized for detection and/or diagnosis of SARS-CoV-2 by  FDA under an Emergency Use Authorization (EUA). This EUA will remain  in effect (meaning this test can be used) for the duration of the  Covid-19 declaration  under Section 564(b)(1) of the Act, 21  U.S.C. section 360bbb-3(b)(1), unless the authorization is  terminated or revoked. Performed at [facility], [address], [place], [place] [address]    DG Chest Port 1 View  Result Date: 04/18/2019 CLINICAL DATA:  Shortness of breath, possible COVID-19 infection. EXAM: PORTABLE CHEST 1 VIEW COMPARISON:  Chest x-rays dated 04/15/2019 and 05/01/2015. FINDINGS: Patchy bilateral airspace opacities, worsened compared to the earlier chest x-ray of 04/15/2019. No pleural effusion or pneumothorax is seen. Heart size and mediastinal contours are within normal limits. Osseous structures about the chest are unremarkable. IMPRESSION: Patchy bilateral airspace opacities, worsened compared to the earlier chest x-ray of 04/15/2019, most likely multifocal pneumonia. Electronically  Signed   By: Franki Cabot M.D.   On: 04/18/2019 13:04    Pending Labs [employer] (From admission, onward)    Start     Ordered   Signed and Held  HIV Antibody (routine testing w rflx)  (HIV Antibody (Routine testing w reflex) panel)  Once,   R     Signed and Held   Signed and Held  ABO/Rh  Once,   R     Signed and Held   Signed and Held  CBC with Differential/Platelet  Daily,   R     Signed and Held   Signed and Held  Comprehensive metabolic panel  Daily,   R     Signed and Held   Signed and Held  C-reactive protein  Daily,   R     Signed and Held   Signed and Held  D-dimer, quantitative (not at [facility])  Daily,   R     Signed and Held   Signed and Held  Ferritin  Daily,   R     Signed and Held          Vitals/Pain Today's Vitals   04/18/19 1552 04/18/19 1600 04/18/19 1830 04/18/19 1914  BP:  123/89 (!) 137/98 111/80  Pulse:  92 (!) 104 92  Resp:  (!) 30 (!) 25   Temp:      TempSrc:      SpO2:  100% 99% 100%  PainSc: 0-No pain       Isolation Precautions Airborne and Contact precautions  Medications Medications  remdesivir 200 mg in sodium chloride 0.9% 250 mL IVPB (200 mg Intravenous New Bag/Given 04/18/19 2032)    Followed by  remdesivir 100 mg in sodium chloride 0.9 % 100 mL IVPB (has no administration in time range)  albuterol (VENTOLIN HFA) 108 (90 Base) MCG/ACT inhaler 4 puff (4 puffs Inhalation Given 04/18/19 1336)  dexamethasone (DECADRON) injection 8 mg (8 mg Intravenous Given 04/18/19 1335)  acetaminophen (TYLENOL) tablet 650 mg (650 mg Oral Given 04/18/19 1333)  aerochamber Z-Stat Plus/medium (1 each  Given 04/18/19 1336)    Mobility walks Low fall risk   Focused Assessments Pulmonary Assessment Handoff:  Lung sounds:   O2 Device: Room Air        R Recommendations: See Admitting Provider Note  Report given to:   Additional Notes: Pt speaks very little english and also understands very little. Interpreter will be needed.

## 2019-04-18 NOTE — ED Triage Notes (Signed)
Patient reports cough, was seen at [facility] on Friday and swabbed for COVID, results not back yet.
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
## 2019-04-19 LAB — CBC WITH DIFFERENTIAL/PLATELET
Abs Immature Granulocytes: 0.01 10*3/uL (ref 0.00–0.07)
Basophils Absolute: 0 10*3/uL (ref 0.0–0.1)
Basophils Relative: 0 %
Eosinophils Absolute: 0 10*3/uL (ref 0.0–0.5)
Eosinophils Relative: 0 %
HCT: 40.5 % (ref 36.0–46.0)
Hemoglobin: 13.4 g/dL (ref 12.0–15.0)
Immature Granulocytes: 0 %
Lymphocytes Relative: 41 %
Lymphs Abs: 1.2 10*3/uL (ref 0.7–4.0)
MCH: 30.9 pg (ref 26.0–34.0)
MCHC: 33.1 g/dL (ref 30.0–36.0)
MCV: 93.3 fL (ref 80.0–100.0)
Monocytes Absolute: 0.4 10*3/uL (ref 0.1–1.0)
Monocytes Relative: 13 %
Neutro Abs: 1.4 10*3/uL — ABNORMAL LOW (ref 1.7–7.7)
Neutrophils Relative %: 46 %
Platelets: 287 10*3/uL (ref 150–400)
RBC: 4.34 MIL/uL (ref 3.87–5.11)
RDW: 12.4 % (ref 11.5–15.5)
WBC: 3 10*3/uL — ABNORMAL LOW (ref 4.0–10.5)
nRBC: 0 % (ref 0.0–0.2)

## 2019-04-19 LAB — ABO/RH: ABO/RH(D): O POS

## 2019-04-19 LAB — COMPREHENSIVE METABOLIC PANEL
ALT: 40 U/L (ref 0–44)
AST: 34 U/L (ref 15–41)
Albumin: 3.4 g/dL — ABNORMAL LOW (ref 3.5–5.0)
Alkaline Phosphatase: 72 U/L (ref 38–126)
Anion gap: 10 (ref 5–15)
BUN: 11 mg/dL (ref 6–20)
CO2: 24 mmol/L (ref 22–32)
Calcium: 8.8 mg/dL — ABNORMAL LOW (ref 8.9–10.3)
Chloride: 104 mmol/L (ref 98–111)
Creatinine, Ser: 0.56 mg/dL (ref 0.44–1.00)
GFR calc Af Amer: >60 mL/min (ref >60)
GFR calc non Af Amer: >60 mL/min (ref >60)
Glucose, Bld: 124 mg/dL — ABNORMAL HIGH (ref 70–99)
Potassium: 4 mmol/L (ref 3.5–5.1)
Sodium: 138 mmol/L (ref 135–145)
Total Bilirubin: 0.5 mg/dL (ref 0.3–1.2)
Total Protein: 7.1 g/dL (ref 6.5–8.1)

## 2019-04-19 LAB — C-REACTIVE PROTEIN: CRP: 4.4 mg/dL — ABNORMAL HIGH (ref <1.0)

## 2019-04-19 LAB — GLUCOSE, CAPILLARY
Glucose-Capillary: 112 mg/dL — ABNORMAL HIGH (ref 70–99)
Glucose-Capillary: 116 mg/dL — ABNORMAL HIGH (ref 70–99)
Glucose-Capillary: 114 mg/dL — ABNORMAL HIGH (ref 70–99)
Glucose-Capillary: 194 mg/dL — ABNORMAL HIGH (ref 70–99)

## 2019-04-19 LAB — D-DIMER, QUANTITATIVE: D-Dimer, Quant: 0.29 ug/mL-FEU (ref 0.00–0.50)

## 2019-04-19 LAB — FERRITIN: Ferritin: 257 ng/mL (ref 11–307)

## 2019-04-19 LAB — HIV ANTIBODY (ROUTINE TESTING W REFLEX): HIV Screen 4th Generation wRfx: NONREACTIVE

## 2019-04-19 MED ORDER — INFLUENZA VAC SPLIT QUAD 0.5 ML IM SUSY
0.5000 mL | PREFILLED_SYRINGE | INTRAMUSCULAR | Status: AC
  Administered 2019-04-20: 0.5 mL via INTRAMUSCULAR
  Filled 2019-04-19: qty 0.5

## 2019-04-19 NOTE — Plan of Care (Signed)

## 2019-04-19 NOTE — Progress Notes (Signed)
Pt updated on status of transfer to [facility] hospital. No bed has yet been assigned for this patient. Advised pt that I would update her when bed assignment was made. Pt stated understanding. Pt has been up in room, ambulating to bathroom with O2 on, SaO2 remains 98 - 100%. Respirations slightly labored with exertion but quickly recovers.

## 2019-04-19 NOTE — Progress Notes (Signed)
PROGRESS NOTE    Molly Herman  VQM:[id number] DOB: August 04, 1975 DOA: 04/18/2019 PCP: Patient, No Pcp Per   Brief Narrative:  Per HPI: Molly Herman is a 43 y.o. female with medical history significant for gestational diabetes mellitus, traumatic brain injury.  Patient is Spanish-speaking with limited English but able to respond to simple questions.  Presented to the ED with complaints of cough and difficulty breathing, with fevers at home for the past week.  Patient was seen in the ED 12/20 for similar symptoms, she had had a Covid test done 2 days prior, she was still awaiting the results.  Patient reports multiple coworkers have Covid 19 infection.  12/24: Patient was admitted with acute hypoxemic respiratory failure secondary to COVID-19 pneumonia and started on dexamethasone and remdesivir.  She currently remains on 2 L nasal cannula oxygen without any acute respiratory distress.  Her CRP is 4.4.  She is appropriate for transfer to [facility] for further evaluation and care.  This was discussed with her via use of translator as she is Spanish-speaking.  She denies any other acute symptoms at the moment.  Assessment & Plan:   Principal Problem:   Pneumonia due to COVID-19 virus   Acute hypoxemic respiratory failure secondary to COVID-19 pneumonia -Continue on dexamethasone and remdesivir -Monitor inflammatory markers -Wean oxygen as tolerated -Continue mucolytic's and inhalers as needed -Continue monitoring blood glucose carefully -Transfer to [facility] for further evaluation and care   DVT prophylaxis: Lovenox Code Status: Full code Family Communication: Discussed with patient via translator as she is Spanish-speaking Disposition Plan: Transfer to [facility] and continue on dexamethasone and remdesivir.  Monitor inflammatory markers.   Consultants:   None  Procedures:   None  Antimicrobials:  Anti-infectives (From admission, onward)   Start     Dose/Rate Route  Frequency Ordered Stop   04/19/19 1000  remdesivir 100 mg in sodium chloride 0.9 % 100 mL IVPB     100 mg 200 mL/hr over 30 Minutes Intravenous Daily 04/18/19 1723 04/23/19 0959   04/18/19 1730  remdesivir 200 mg in sodium chloride 0.9% 250 mL IVPB     200 mg 580 mL/hr over 30 Minutes Intravenous Once 04/18/19 1723 04/18/19 2106       Subjective: Patient seen and evaluated today with no new acute complaints or concerns. No acute concerns or events noted overnight.  She remains on 2 L nasal cannula oxygen.  Objective: Vitals:   04/18/19 1914 04/18/19 2100 04/18/19 2151 04/19/19 0500  BP: 111/80 128/87 112/77 108/75  Pulse: 92 85 87 69  Resp:  (!) 22 20 20   Temp:   98.7 F (37.1 C) 98.5 F (36.9 C)  TempSrc:   Oral Oral  SpO2: 100% 99% 99% 99%  Weight:   76 kg   Height:   5\' 2"  (1.575 m)     Intake/Output Summary (Last 24 hours) at 04/19/2019 1026 Last data filed at 04/19/2019 0300 Gross per 24 hour  Intake --  Output 600 ml  Net -600 ml   Filed Weights   04/18/19 2151  Weight: 76 kg    Examination:  General exam: Appears calm and comfortable  Respiratory system: Clear to auscultation. Respiratory effort normal.  Currently on 2 L nasal cannula oxygen. Cardiovascular system: S1 & S2 heard, RRR. No JVD, murmurs, rubs, gallops or clicks. No pedal edema. Gastrointestinal system: Abdomen is nondistended, soft and nontender. No organomegaly or masses felt. Normal bowel sounds heard. Central nervous system: Alert and  oriented. No focal neurological deficits. Extremities: Symmetric 5 x 5 power. Skin: No rashes, lesions or ulcers Psychiatry: Judgement and insight appear normal. Mood & affect appropriate.     Data Reviewed: I have personally reviewed following labs and imaging studies  CBC: Recent Labs  Lab 04/15/19 0842 04/18/19 1238 04/19/19 0711  WBC 3.7* 4.0 3.0*  NEUTROABS 2.4 2.0 1.4*  HGB 14.9 14.9 13.4  HCT 44.1 44.7 40.5  MCV 93.0 93.9 93.3  PLT 163 258  287   Basic Metabolic Panel: Recent Labs  Lab 04/15/19 0842 04/18/19 1238 04/19/19 0711  NA 139 136 138  K 3.8 3.8 4.0  CL 109 103 104  CO2 20* 24 24  GLUCOSE 113* 98 124*  BUN 6 10 11   CREATININE 0.67 0.72 0.56  CALCIUM 8.5* 8.7* 8.8*   GFR: Estimated Creatinine Clearance: 85.7 mL/min (by C-G formula based on SCr of 0.56 mg/dL). Liver Function Tests: Recent Labs  Lab 04/15/19 0842 04/18/19 1238 04/19/19 0711  AST 71* 44* 34  ALT 51* 45* 40  ALKPHOS 75 81 72  BILITOT 0.4 0.6 0.5  PROT 6.8 7.5 7.1  ALBUMIN 3.4* 3.6 3.4*   No results for input(s): LIPASE, AMYLASE in the last 168 hours. No results for input(s): AMMONIA in the last 168 hours. Coagulation Profile: No results for input(s): INR, PROTIME in the last 168 hours. Cardiac Enzymes: No results for input(s): CKTOTAL, CKMB, CKMBINDEX, TROPONINI in the last 168 hours. BNP (last 3 results) No results for input(s): PROBNP in the last 8760 hours. HbA1C: No results for input(s): HGBA1C in the last 72 hours. CBG: Recent Labs  Lab 04/18/19 2204 04/19/19 0550 04/19/19 0721  GLUCAP 155* 112* 116*   Lipid Profile: Recent Labs    04/18/19 1238  TRIG 217*   Thyroid Function Tests: No results for input(s): TSH, T4TOTAL, FREET4, T3FREE, THYROIDAB in the last 72 hours. Anemia Panel: Recent Labs    04/18/19 1238 04/19/19 0711  FERRITIN 326* 257   Sepsis Labs: Recent Labs  Lab 04/18/19 1238  PROCALCITON <0.10  LATICACIDVEN 0.9    Recent Results (from the past 240 hour(s))  Blood Culture (routine x 2)     Status: None (Preliminary result)   Collection Time: 04/18/19 12:43 PM   Specimen: Right Antecubital; Blood  Result Value Ref Range Status   Specimen Description RIGHT ANTECUBITAL  Final   Special Requests   Final    Blood Culture adequate volume BOTTLES DRAWN AEROBIC AND ANAEROBIC   Culture   Final    NO GROWTH < 24 HOURS Performed at [facility], [address] [place], [place] [address]     Report Status PENDING  Incomplete  Blood Culture (routine x 2)     Status: None (Preliminary result)   Collection Time: 04/18/19  2:03 PM   Specimen: Site Not Specified; Blood  Result Value Ref Range Status   Specimen Description SITE NOT SPECIFIED  Final   Special Requests   Final    BOTTLES DRAWN AEROBIC AND ANAEROBIC Blood Culture adequate volume   Culture   Final    NO GROWTH < 24 HOURS Performed at [facility], [address] [place], [place] [address]    Report Status PENDING  Incomplete  Respiratory Panel by RT PCR (Flu A&B, Covid) - Nasopharyngeal Swab     Status: Abnormal   Collection Time: 04/18/19  4:11 PM   Specimen: Nasopharyngeal Swab  Result Value Ref Range Status   SARS Coronavirus 2 by RT  PCR POSITIVE (A) NEGATIVE Final    Comment: RESULT CALLED TO, READ BACK BY AND VERIFIED WITH: MARTIN,D AT 1704 ON 12.23.20 BY ISLEY,B (NOTE) SARS-CoV-2 target nucleic acids are DETECTED. SARS-CoV-2 RNA is generally detectable in upper respiratory specimens  during the acute phase of infection. Positive results are indicative of the presence of the identified virus, but do not rule out bacterial infection or co-infection with other pathogens not detected by the test. Clinical correlation with patient history and other diagnostic information is necessary to determine patient infection status. The expected result is Negative. Fact Sheet for Patients:  https://www.moore.com/ Fact Sheet for Healthcare Providers: https://www.young.biz/ This test is not yet approved or cleared by the [place] FDA and  has been authorized for detection and/or diagnosis of SARS-CoV-2 by FDA under an Emergency Use Authorization (EUA).  This EUA will remain in effect (meaning this test can be use d) for the duration of  the COVID-19 declaration under Section 564(b)(1) of the Act, 21 U.S.C. section 360bbb-3(b)(1), unless the authorization is terminated or  revoked sooner.    Influenza A by PCR NEGATIVE NEGATIVE Final   Influenza B by PCR NEGATIVE NEGATIVE Final    Comment: (NOTE) The Xpert Xpress SARS-CoV-2/FLU/RSV assay is intended as an aid in  the diagnosis of influenza from Nasopharyngeal swab specimens and  should not be used as a sole basis for treatment. Nasal washings and  aspirates are unacceptable for Xpert Xpress SARS-CoV-2/FLU/RSV  testing. Fact Sheet for Patients: https://www.moore.com/ Fact Sheet for Healthcare Providers: https://www.young.biz/ This test is not yet approved or cleared by the [place] FDA and  has been authorized for detection and/or diagnosis of SARS-CoV-2 by  FDA under an Emergency Use Authorization (EUA). This EUA will remain  in effect (meaning this test can be used) for the duration of the  Covid-19 declaration under Section 564(b)(1) of the Act, 21  U.S.C. section 360bbb-3(b)(1), unless the authorization is  terminated or revoked. Performed at [facility], [address] [place], [place] [address]          Radiology Studies: [facility] Chest [facility] 1 View  Result Date: 04/18/2019 CLINICAL DATA:  Shortness of breath, possible COVID-19 infection. EXAM: PORTABLE CHEST 1 VIEW COMPARISON:  Chest x-rays dated 04/15/2019 and 05/01/2015. FINDINGS: Patchy bilateral airspace opacities, worsened compared to the earlier chest x-ray of 04/15/2019. No pleural effusion or pneumothorax is seen. Heart size and mediastinal contours are within normal limits. Osseous structures about the chest are unremarkable. IMPRESSION: Patchy bilateral airspace opacities, worsened compared to the earlier chest x-ray of 04/15/2019, most likely multifocal pneumonia. Electronically Signed   By: Bary Richard M.D.   On: 04/18/2019 13:04        Scheduled Meds: . dexamethasone (DECADRON) injection  6 mg Intravenous Q24H  . dextromethorphan-guaiFENesin  1 tablet Oral BID  . enoxaparin (LOVENOX)  injection  40 mg Subcutaneous QHS   Continuous Infusions: . remdesivir 100 mg in NS 100 mL       LOS: 1 day    Time spent: 30 minutes    Maksim Peregoy Hoover Brunette, DO [facility] Pager [phone]  If 7PM-7AM, please contact night-coverage [email or web address] Password [email or web address] 04/19/2019, 10:26 AM

## 2019-04-19 NOTE — Progress Notes (Signed)
Dr. Manuella Ghazi in to see patient. Advises that pt will be transferred to [facility] in [place]. Pt notified of plan via use of translator services and states understanding. Denies c/o at present. Spoke with patient's oldest son Elita Quick via phone and gave him update. He is aware of pending transfer to [facility]. All questions answered to the best of my ability. Son states understanding.l

## 2019-04-20 LAB — COMPREHENSIVE METABOLIC PANEL
ALT: 37 U/L (ref 0–44)
AST: 27 U/L (ref 15–41)
Albumin: 3.3 g/dL — ABNORMAL LOW (ref 3.5–5.0)
Alkaline Phosphatase: 66 U/L (ref 38–126)
Anion gap: 10 (ref 5–15)
BUN: 13 mg/dL (ref 6–20)
CO2: 24 mmol/L (ref 22–32)
Calcium: 8.9 mg/dL (ref 8.9–10.3)
Chloride: 106 mmol/L (ref 98–111)
Creatinine, Ser: 0.6 mg/dL (ref 0.44–1.00)
GFR calc Af Amer: >60 mL/min (ref >60)
GFR calc non Af Amer: >60 mL/min (ref >60)
Glucose, Bld: 98 mg/dL (ref 70–99)
Potassium: 3.8 mmol/L (ref 3.5–5.1)
Sodium: 140 mmol/L (ref 135–145)
Total Bilirubin: 0.3 mg/dL (ref 0.3–1.2)
Total Protein: 6.8 g/dL (ref 6.5–8.1)

## 2019-04-20 LAB — CBC WITH DIFFERENTIAL/PLATELET
Abs Immature Granulocytes: 0.04 10*3/uL (ref 0.00–0.07)
Basophils Absolute: 0 10*3/uL (ref 0.0–0.1)
Basophils Relative: 0 %
Eosinophils Absolute: 0 10*3/uL (ref 0.0–0.5)
Eosinophils Relative: 0 %
HCT: 41.3 % (ref 36.0–46.0)
Hemoglobin: 13.6 g/dL (ref 12.0–15.0)
Immature Granulocytes: 1 %
Lymphocytes Relative: 41 %
Lymphs Abs: 2.4 10*3/uL (ref 0.7–4.0)
MCH: 31.1 pg (ref 26.0–34.0)
MCHC: 32.9 g/dL (ref 30.0–36.0)
MCV: 94.3 fL (ref 80.0–100.0)
Monocytes Absolute: 0.6 10*3/uL (ref 0.1–1.0)
Monocytes Relative: 10 %
Neutro Abs: 2.9 10*3/uL (ref 1.7–7.7)
Neutrophils Relative %: 48 %
Platelets: 344 10*3/uL (ref 150–400)
RBC: 4.38 MIL/uL (ref 3.87–5.11)
RDW: 12.4 % (ref 11.5–15.5)
WBC: 5.9 10*3/uL (ref 4.0–10.5)
nRBC: 0 % (ref 0.0–0.2)

## 2019-04-20 LAB — FERRITIN: Ferritin: 235 ng/mL (ref 11–307)

## 2019-04-20 LAB — C-REACTIVE PROTEIN: CRP: 1.2 mg/dL — ABNORMAL HIGH (ref <1.0)

## 2019-04-20 LAB — D-DIMER, QUANTITATIVE: D-Dimer, Quant: 0.27 ug/mL-FEU (ref 0.00–0.50)

## 2019-04-20 LAB — GLUCOSE, CAPILLARY: Glucose-Capillary: 109 mg/dL — ABNORMAL HIGH (ref 70–99)

## 2019-04-20 NOTE — Progress Notes (Signed)
PROGRESS NOTE    Nat MathRita D Pain  WUJ:[id number] DOB: 07/03/1975 DOA: 04/18/2019 PCP: Patient, No Pcp Per   Brief Narrative:  Per HPI: Molly GuntherRita D Alonsosantamariais a 43 y.o.femalewith medical history significant forgestational diabetes mellitus,traumatic brain injury. Patient is Spanish-speaking with limited English but able torespond tosimple questions. Presented to the ED with complaints ofcough and difficulty breathing,with fevers at home for the past week. Patient was seen in the ED 12/20 for similar symptoms, she had had a Covid test done 2 days prior,she was still awaiting the results. Patient reports multiple coworkers have Covid 19 infection.  12/24: Patient was admitted with acute hypoxemic respiratory failure secondary to COVID-19 pneumonia and started on dexamethasone and remdesivir.  She currently remains on 2 L nasal cannula oxygen without any acute respiratory distress.  Her CRP is 4.4.  She is appropriate for [facility] for further evaluation and care.  This was discussed with her via use of translator as she is Spanish-speaking.  She denies any other acute symptoms at the moment.  12/25: Patient continues to remain on 2 L nasal cannula oxygen with no worsening respiratory status noted.  She appears to be feeling somewhat better and is currently on day 3 of remdesivir treatment.  [facility] and will continue treatment here.  Assessment & Plan:   Principal Problem:   Pneumonia due to COVID-19 virus   Acute hypoxemic respiratory failure secondary to COVID-19 pneumonia -Continue on dexamethasone and remdesivir -Monitor inflammatory markers -Wean oxygen as tolerated -Continue mucolytic's and inhalers as needed -Continue monitoring blood glucose carefully -Transfer to [facility] for further evaluation and care -Try incentive spirometry and up in chair with ambulation in room   DVT prophylaxis: Lovenox Code Status: Full code Family  Communication: Discussed with patient via translator as she is Spanish-speaking Disposition Plan:  Continue current treatment plan with dexamethasone and remdesivir monitor inflammatory markers   Consultants:   None  Procedures:   None  Antimicrobials:  Anti-infectives (From admission, onward)   Start     Dose/Rate Route Frequency Ordered Stop   04/19/19 1000  remdesivir 100 mg in sodium chloride 0.9 % 100 mL IVPB     100 mg 200 mL/hr over 30 Minutes Intravenous Daily 04/18/19 1723 04/23/19 0959   04/18/19 1730  remdesivir 200 mg in sodium chloride 0.9% 250 mL IVPB     200 mg 580 mL/hr over 30 Minutes Intravenous Once 04/18/19 1723 04/18/19 2106       Subjective: Patient seen and evaluated today with no new acute complaints or concerns. No acute concerns or events noted overnight.  She denies any worsening shortness of breath or cough.  No fever overnight.  She appears to be doing well on current treatment plan and is still on 2 L nasal cannula oxygen.  Objective: Vitals:   04/19/19 1700 04/19/19 2013 04/20/19 0648 04/20/19 0849  BP:  122/75 103/71 109/71  Pulse: 76 65 (!) 58 67  Resp: 20 16 (!) 22 14  Temp:  98.5 F (36.9 C) 98 F (36.7 C) 97.9 F (36.6 C)  TempSrc:  Oral Oral Oral  SpO2: 100% 99% 99% 99%  Weight:      Height:        Intake/Output Summary (Last 24 hours) at 04/20/2019 1206 Last data filed at 04/19/2019 2341 Gross per 24 hour  Intake 1960 ml  Output --  Net 1960 ml   Filed Weights   04/18/19 2151  Weight: 76 kg  Examination:  General exam: Appears calm and comfortable  Respiratory system: Clear to auscultation. Respiratory effort normal.  Currently on 2 L nasal cannula oxygen Cardiovascular system: S1 & S2 heard, RRR. No JVD, murmurs, rubs, gallops or clicks. No pedal edema. Gastrointestinal system: Abdomen is nondistended, soft and nontender. No organomegaly or masses felt. Normal bowel sounds heard. Central nervous system: Alert  and oriented. No focal neurological deficits. Extremities: Symmetric 5 x 5 power. Skin: No rashes, lesions or ulcers Psychiatry: Judgement and insight appear normal. Mood & affect appropriate.     Data Reviewed: I have personally reviewed following labs and imaging studies  CBC: Recent Labs  Lab 04/15/19 0842 04/18/19 1238 04/19/19 0711 04/20/19 0658  WBC 3.7* 4.0 3.0* 5.9  NEUTROABS 2.4 2.0 1.4* 2.9  HGB 14.9 14.9 13.4 13.6  HCT 44.1 44.7 40.5 41.3  MCV 93.0 93.9 93.3 94.3  PLT 163 258 287 924   Basic Metabolic Panel: Recent Labs  Lab 04/15/19 0842 04/18/19 1238 04/19/19 0711 04/20/19 0658  NA 139 136 138 140  K 3.8 3.8 4.0 3.8  CL 109 103 104 106  CO2 20* 24 24 24   GLUCOSE 113* 98 124* 98  BUN 6 10 11 13   CREATININE 0.67 0.72 0.56 0.60  CALCIUM 8.5* 8.7* 8.8* 8.9   GFR: Estimated Creatinine Clearance: 85.7 mL/min (by C-G formula based on SCr of 0.6 mg/dL). Liver Function Tests: Recent Labs  Lab 04/15/19 0842 04/18/19 1238 04/19/19 0711 04/20/19 0658  AST 71* 44* 34 27  ALT 51* 45* 40 37  ALKPHOS 75 81 72 66  BILITOT 0.4 0.6 0.5 0.3  PROT 6.8 7.5 7.1 6.8  ALBUMIN 3.4* 3.6 3.4* 3.3*   No results for input(s): LIPASE, AMYLASE in the last 168 hours. No results for input(s): AMMONIA in the last 168 hours. Coagulation Profile: No results for input(s): INR, PROTIME in the last 168 hours. Cardiac Enzymes: No results for input(s): CKTOTAL, CKMB, CKMBINDEX, TROPONINI in the last 168 hours. BNP (last 3 results) No results for input(s): PROBNP in the last 8760 hours. HbA1C: No results for input(s): HGBA1C in the last 72 hours. CBG: Recent Labs  Lab 04/19/19 0550 04/19/19 0721 04/19/19 1111 04/19/19 1607 04/20/19 0647  GLUCAP 112* 116* 114* 194* 109*   Lipid Profile: Recent Labs    04/18/19 1238  TRIG 217*   Thyroid Function Tests: No results for input(s): TSH, T4TOTAL, FREET4, T3FREE, THYROIDAB in the last 72 hours. Anemia Panel: Recent Labs     04/19/19 0711 04/20/19 0658  FERRITIN 257 235   Sepsis Labs: Recent Labs  Lab 04/18/19 1238  PROCALCITON <0.10  LATICACIDVEN 0.9    Recent Results (from the past 240 hour(s))  Blood Culture (routine x 2)     Status: None (Preliminary result)   Collection Time: 04/18/19 12:43 PM   Specimen: Right Antecubital; Blood  Result Value Ref Range Status   Specimen Description RIGHT ANTECUBITAL  Final   Special Requests   Final    Blood Culture adequate volume BOTTLES DRAWN AEROBIC AND ANAEROBIC   Culture   Final    NO GROWTH 2 DAYS Performed at [facility], [address], [place], [place] [address]    Report Status PENDING  Incomplete  Blood Culture (routine x 2)     Status: None (Preliminary result)   Collection Time: 04/18/19  2:03 PM   Specimen: Site Not Specified; Blood  Result Value Ref Range Status   Specimen Description SITE NOT SPECIFIED  Final  Special Requests   Final    BOTTLES DRAWN AEROBIC AND ANAEROBIC Blood Culture adequate volume   Culture   Final    NO GROWTH 2 DAYS Performed at [facility], [address] [place], [place] [address]    Report Status PENDING  Incomplete  Respiratory Panel by RT PCR (Flu A&B, Covid) - Nasopharyngeal Swab     Status: Abnormal   Collection Time: 04/18/19  4:11 PM   Specimen: Nasopharyngeal Swab  Result Value Ref Range Status   SARS Coronavirus 2 by RT PCR POSITIVE (A) NEGATIVE Final    Comment: RESULT CALLED TO, READ BACK BY AND VERIFIED WITH: MARTIN,D AT 1704 ON 12.23.20 BY ISLEY,B (NOTE) SARS-CoV-2 target nucleic acids are DETECTED. SARS-CoV-2 RNA is generally detectable in upper respiratory specimens  during the acute phase of infection. Positive results are indicative of the presence of the identified virus, but do not rule out bacterial infection or co-infection with other pathogens not detected by the test. Clinical correlation with patient history and other diagnostic information is necessary to determine  patient infection status. The expected result is Negative. Fact Sheet for Patients:  https://www.moore.com/ Fact Sheet for Healthcare Providers: https://www.young.biz/ This test is not yet approved or cleared by the [place] FDA and  has been authorized for detection and/or diagnosis of SARS-CoV-2 by FDA under an Emergency Use Authorization (EUA).  This EUA will remain in effect (meaning this test can be use d) for the duration of  the COVID-19 declaration under Section 564(b)(1) of the Act, 21 U.S.C. section 360bbb-3(b)(1), unless the authorization is terminated or revoked sooner.    Influenza A by PCR NEGATIVE NEGATIVE Final   Influenza B by PCR NEGATIVE NEGATIVE Final    Comment: (NOTE) The Xpert Xpress SARS-CoV-2/FLU/RSV assay is intended as an aid in  the diagnosis of influenza from Nasopharyngeal swab specimens and  should not be used as a sole basis for treatment. Nasal washings and  aspirates are unacceptable for Xpert Xpress SARS-CoV-2/FLU/RSV  testing. Fact Sheet for Patients: https://www.moore.com/ Fact Sheet for Healthcare Providers: https://www.young.biz/ This test is not yet approved or cleared by the [place] FDA and  has been authorized for detection and/or diagnosis of SARS-CoV-2 by  FDA under an Emergency Use Authorization (EUA). This EUA will remain  in effect (meaning this test can be used) for the duration of the  Covid-19 declaration under Section 564(b)(1) of the Act, 21  U.S.C. section 360bbb-3(b)(1), unless the authorization is  terminated or revoked. Performed at [facility], [address], [place], [place] [address]          Radiology Studies: [facility] Chest [facility] 1 View  Result Date: 04/18/2019 CLINICAL DATA:  Shortness of breath, possible COVID-19 infection. EXAM: PORTABLE CHEST 1 VIEW COMPARISON:  Chest x-rays dated 04/15/2019 and 05/01/2015. FINDINGS: Patchy  bilateral airspace opacities, worsened compared to the earlier chest x-ray of 04/15/2019. No pleural effusion or pneumothorax is seen. Heart size and mediastinal contours are within normal limits. Osseous structures about the chest are unremarkable. IMPRESSION: Patchy bilateral airspace opacities, worsened compared to the earlier chest x-ray of 04/15/2019, most likely multifocal pneumonia. Electronically Signed   By: Bary Richard M.D.   On: 04/18/2019 13:04        Scheduled Meds: . dexamethasone (DECADRON) injection  6 mg Intravenous Q24H  . dextromethorphan-guaiFENesin  1 tablet Oral BID  . enoxaparin (LOVENOX) injection  40 mg Subcutaneous QHS   Continuous Infusions: . remdesivir 100 mg in NS 100 mL 100  mg (04/20/19 1005)     LOS: 2 days    Time spent: 30 minutes    Ritu Gagliardo Hoover Brunette, DO [facility] Pager [phone]  If 7PM-7AM, please contact night-coverage [email or web address] Password [facility] 04/20/2019, 12:06 PM

## 2019-04-21 LAB — COMPREHENSIVE METABOLIC PANEL
ALT: 51 U/L — ABNORMAL HIGH (ref 0–44)
AST: 39 U/L (ref 15–41)
Albumin: 3.3 g/dL — ABNORMAL LOW (ref 3.5–5.0)
Alkaline Phosphatase: 62 U/L (ref 38–126)
Anion gap: 8 (ref 5–15)
BUN: 13 mg/dL (ref 6–20)
CO2: 24 mmol/L (ref 22–32)
Calcium: 8.7 mg/dL — ABNORMAL LOW (ref 8.9–10.3)
Chloride: 105 mmol/L (ref 98–111)
Creatinine, Ser: 0.61 mg/dL (ref 0.44–1.00)
GFR calc Af Amer: >60 mL/min (ref >60)
GFR calc non Af Amer: >60 mL/min (ref >60)
Glucose, Bld: 97 mg/dL (ref 70–99)
Potassium: 3.9 mmol/L (ref 3.5–5.1)
Sodium: 137 mmol/L (ref 135–145)
Total Bilirubin: 0.4 mg/dL (ref 0.3–1.2)
Total Protein: 6.6 g/dL (ref 6.5–8.1)

## 2019-04-21 LAB — CBC WITH DIFFERENTIAL/PLATELET
Abs Immature Granulocytes: 0.06 10*3/uL (ref 0.00–0.07)
Basophils Absolute: 0 10*3/uL (ref 0.0–0.1)
Basophils Relative: 0 %
Eosinophils Absolute: 0 10*3/uL (ref 0.0–0.5)
Eosinophils Relative: 0 %
HCT: 41 % (ref 36.0–46.0)
Hemoglobin: 13.5 g/dL (ref 12.0–15.0)
Immature Granulocytes: 1 %
Lymphocytes Relative: 43 %
Lymphs Abs: 2.6 10*3/uL (ref 0.7–4.0)
MCH: 30.8 pg (ref 26.0–34.0)
MCHC: 32.9 g/dL (ref 30.0–36.0)
MCV: 93.6 fL (ref 80.0–100.0)
Monocytes Absolute: 0.6 10*3/uL (ref 0.1–1.0)
Monocytes Relative: 10 %
Neutro Abs: 2.8 10*3/uL (ref 1.7–7.7)
Neutrophils Relative %: 46 %
Platelets: 360 10*3/uL (ref 150–400)
RBC: 4.38 MIL/uL (ref 3.87–5.11)
RDW: 12.2 % (ref 11.5–15.5)
WBC: 6.1 10*3/uL (ref 4.0–10.5)
nRBC: 0 % (ref 0.0–0.2)

## 2019-04-21 LAB — FERRITIN: Ferritin: 228 ng/mL (ref 11–307)

## 2019-04-21 LAB — D-DIMER, QUANTITATIVE: D-Dimer, Quant: 0.27 ug/mL-FEU (ref 0.00–0.50)

## 2019-04-21 LAB — C-REACTIVE PROTEIN: CRP: 0.7 mg/dL (ref <1.0)

## 2019-04-21 NOTE — Progress Notes (Signed)
PROGRESS NOTE    Molly Herman  SAY:[id number] DOB: Jul 18, 1975 DOA: 04/18/2019 PCP: Patient, No Pcp Per   Brief Narrative:  Per HPI: Molly Herman a 43 y.o.femalewith medical history significant forgestational diabetes mellitus,traumatic brain injury. Patient is Spanish-speaking with limited English but able torespond tosimple questions. Presented to the ED with complaints ofcough and difficulty breathing,with fevers at home for the past week. Patient was seen in the ED 12/20 for similar symptoms, she had had a Covid test done 2 days prior,she was still awaiting the results. Patient reports multiple coworkers have Covid 19 infection.  12/24:Patient was admitted with acute hypoxemic respiratory failure secondary to COVID-19 pneumonia and started on dexamethasone and remdesivir. She currently remains on 2 L nasal cannula oxygen without any acute respiratory distress. Her CRP is 4.4. She is appropriate for transfer to [facility] for further evaluation and care. This was discussed with her via use of translator as she is Spanish-speaking. She denies any other acute symptoms at the moment.  12/25: Patient continues to remain on 2 L nasal cannula oxygen with no worsening respiratory status noted.  She appears to be feeling somewhat better and is currently on day 3 of remdesivir treatment.  No beds [facility] at [facility], and will continue treatment here.  12/26: Patient is currently on 0.5 L nasal cannula with improvement in overall respiratory symptoms noted.  We will plan to wean off oxygen today and anticipate discharge by a.m. after her fifth dose of remdesivir.  No need for transfer to [facility].  Assessment & Plan:   Principal Problem:   Pneumonia due to COVID-19 virus   Acute hypoxemic respiratory failure secondary to COVID-19 pneumonia -Continue on dexamethasone and remdesivir for one more day with last day 12/27 -Monitor inflammatory markers which are  downtrending -Wean oxygen as tolerated -Continue mucolytic's and inhalers as needed -Continue monitoring blood glucose carefully -Try incentive spirometry and up in chair with ambulation in room   DVT prophylaxis:Lovenox Code Status:Full code Family Communication:Discussed with son on phone Disposition Plan: Continue current treatment plan with dexamethasone and remdesivir monitor inflammatory markers. Anticipate DC by am if stable on RA after last dose of remdesivir.   Consultants:  None  Procedures:  None  Antimicrobials:  Anti-infectives (From admission, onward)   Start     Dose/Rate Route Frequency Ordered Stop   04/19/19 1000  remdesivir 100 mg in sodium chloride 0.9 % 100 mL IVPB     100 mg 200 mL/hr over 30 Minutes Intravenous Daily 04/18/19 1723 04/23/19 0959   04/18/19 1730  remdesivir 200 mg in sodium chloride 0.9% 250 mL IVPB     200 mg 580 mL/hr over 30 Minutes Intravenous Once 04/18/19 1723 04/18/19 2106       Subjective: Patient seen and evaluated today with no new acute complaints or concerns. No acute concerns or events noted overnight.  She states that her shortness of breath has improved.  She denies any cough or chest pain.  She is only on 0.5 L nasal cannula.  Objective: Vitals:   04/20/19 1633 04/20/19 2022 04/20/19 2131 04/21/19 0530  BP: 104/71  108/78 102/72  Pulse: 65 69 63 (!) 57  Resp: 14 16 16 18   Temp: 98.5 F (36.9 C)  98 F (36.7 C) 98.5 F (36.9 C)  TempSrc: Oral  Oral Oral  SpO2: 98% 98% 99% 99%  Weight:      Height:       No intake or output data in  the 24 hours ending 04/21/19 1156 Filed Weights   04/18/19 2151  Weight: 76 kg    Examination:  General exam: Appears calm and comfortable  Respiratory system: Clear to auscultation. Respiratory effort normal.  Currently on 0.5 L nasal cannula. Cardiovascular system: S1 & S2 heard, RRR. No JVD, murmurs, rubs, gallops or clicks. No pedal edema. Gastrointestinal  system: Abdomen is nondistended, soft and nontender. No organomegaly or masses felt. Normal bowel sounds heard. Central nervous system: Alert and oriented. No focal neurological deficits. Extremities: Symmetric 5 x 5 power. Skin: No rashes, lesions or ulcers Psychiatry: Judgement and insight appear normal. Mood & affect appropriate.     Data Reviewed: I have personally reviewed following labs and imaging studies  CBC: Recent Labs  Lab 04/15/19 0842 04/18/19 1238 04/19/19 0711 04/20/19 0658 04/21/19 0752  WBC 3.7* 4.0 3.0* 5.9 6.1  NEUTROABS 2.4 2.0 1.4* 2.9 2.8  HGB 14.9 14.9 13.4 13.6 13.5  HCT 44.1 44.7 40.5 41.3 41.0  MCV 93.0 93.9 93.3 94.3 93.6  PLT 163 258 287 344 182   Basic Metabolic Panel: Recent Labs  Lab 04/15/19 0842 04/18/19 1238 04/19/19 0711 04/20/19 0658 04/21/19 0752  NA 139 136 138 140 137  K 3.8 3.8 4.0 3.8 3.9  CL 109 103 104 106 105  CO2 20* 24 24 24 24   GLUCOSE 113* 98 124* 98 97  BUN 6 10 11 13 13   CREATININE 0.67 0.72 0.56 0.60 0.61  CALCIUM 8.5* 8.7* 8.8* 8.9 8.7*   GFR: Estimated Creatinine Clearance: 85.7 mL/min (by C-G formula based on SCr of 0.61 mg/dL). Liver Function Tests: Recent Labs  Lab 04/15/19 0842 04/18/19 1238 04/19/19 0711 04/20/19 0658 04/21/19 0752  AST 71* 44* 34 27 39  ALT 51* 45* 40 37 51*  ALKPHOS 75 81 72 66 62  BILITOT 0.4 0.6 0.5 0.3 0.4  PROT 6.8 7.5 7.1 6.8 6.6  ALBUMIN 3.4* 3.6 3.4* 3.3* 3.3*   No results for input(s): LIPASE, AMYLASE in the last 168 hours. No results for input(s): AMMONIA in the last 168 hours. Coagulation Profile: No results for input(s): INR, PROTIME in the last 168 hours. Cardiac Enzymes: No results for input(s): CKTOTAL, CKMB, CKMBINDEX, TROPONINI in the last 168 hours. BNP (last 3 results) No results for input(s): PROBNP in the last 8760 hours. HbA1C: No results for input(s): HGBA1C in the last 72 hours. CBG: Recent Labs  Lab 04/19/19 0550 04/19/19 0721 04/19/19 1111  04/19/19 1607 04/20/19 0647  GLUCAP 112* 116* 114* 194* 109*   Lipid Profile: Recent Labs    04/18/19 1238  TRIG 217*   Thyroid Function Tests: No results for input(s): TSH, T4TOTAL, FREET4, T3FREE, THYROIDAB in the last 72 hours. Anemia Panel: Recent Labs    04/20/19 0658 04/21/19 0752  FERRITIN 235 228   Sepsis Labs: Recent Labs  Lab 04/18/19 1238  PROCALCITON <0.10  LATICACIDVEN 0.9    Recent Results (from the past 240 hour(s))  Blood Culture (routine x 2)     Status: None (Preliminary result)   Collection Time: 04/18/19 12:43 PM   Specimen: Right Antecubital; Blood  Result Value Ref Range Status   Specimen Description RIGHT ANTECUBITAL  Final   Special Requests   Final    Blood Culture adequate volume BOTTLES DRAWN AEROBIC AND ANAEROBIC   Culture   Final    NO GROWTH 3 DAYS Performed at [facility], [address] [place], [place] [address]    Report Status PENDING  Incomplete  Blood Culture (routine x 2)     Status: None (Preliminary result)   Collection Time: 04/18/19  2:03 PM   Specimen: Site Not Specified; Blood  Result Value Ref Range Status   Specimen Description SITE NOT SPECIFIED  Final   Special Requests   Final    BOTTLES DRAWN AEROBIC AND ANAEROBIC Blood Culture adequate volume   Culture   Final    NO GROWTH 3 DAYS Performed at [facility], [address] [place], [place] [address]    Report Status PENDING  Incomplete  Respiratory Panel by RT PCR (Flu A&B, Covid) - Nasopharyngeal Swab     Status: Abnormal   Collection Time: 04/18/19  4:11 PM   Specimen: Nasopharyngeal Swab  Result Value Ref Range Status   SARS Coronavirus 2 by RT PCR POSITIVE (A) NEGATIVE Final    Comment: RESULT CALLED TO, READ BACK BY AND VERIFIED WITH: MARTIN,D AT 1704 ON 12.23.20 BY ISLEY,B (NOTE) SARS-CoV-2 target nucleic acids are DETECTED. SARS-CoV-2 RNA is generally detectable in upper respiratory specimens  during the acute phase of infection. Positive results  are indicative of the presence of the identified virus, but do not rule out bacterial infection or co-infection with other pathogens not detected by the test. Clinical correlation with patient history and other diagnostic information is necessary to determine patient infection status. The expected result is Negative. Fact Sheet for Patients:  https://www.moore.com/https://www.fda.gov/media/142436/download Fact Sheet for Healthcare Providers: https://www.young.biz/https://www.fda.gov/media/142435/download This test is not yet approved or cleared by the [place] FDA and  has been authorized for detection and/or diagnosis of SARS-CoV-2 by FDA under an Emergency Use Authorization (EUA).  This EUA will remain in effect (meaning this test can be use d) for the duration of  the COVID-19 declaration under Section 564(b)(1) of the Act, 21 U.S.C. section 360bbb-3(b)(1), unless the authorization is terminated or revoked sooner.    Influenza A by PCR NEGATIVE NEGATIVE Final   Influenza B by PCR NEGATIVE NEGATIVE Final    Comment: (NOTE) The Xpert Xpress SARS-CoV-2/FLU/RSV assay is intended as an aid in  the diagnosis of influenza from Nasopharyngeal swab specimens and  should not be used as a sole basis for treatment. Nasal washings and  aspirates are unacceptable for Xpert Xpress SARS-CoV-2/FLU/RSV  testing. Fact Sheet for Patients: https://www.moore.com/https://www.fda.gov/media/142436/download Fact Sheet for Healthcare Providers: https://www.young.biz/https://www.fda.gov/media/142435/download This test is not yet approved or cleared by the [place] FDA and  has been authorized for detection and/or diagnosis of SARS-CoV-2 by  FDA under an Emergency Use Authorization (EUA). This EUA will remain  in effect (meaning this test can be used) for the duration of the  Covid-19 declaration under Section 564(b)(1) of the Act, 21  U.S.C. section 360bbb-3(b)(1), unless the authorization is  terminated or revoked. Performed at [facility], [address] [place], [place]  [address]          Radiology Studies: No results found.      Scheduled Meds: . dexamethasone (DECADRON) injection  6 mg Intravenous Q24H  . dextromethorphan-guaiFENesin  1 tablet Oral BID  . enoxaparin (LOVENOX) injection  40 mg Subcutaneous QHS   Continuous Infusions: . remdesivir 100 mg in NS 100 mL 100 mg (04/21/19 0858)     LOS: 3 days    Time spent: 30 minutes    Analiza Cowger Hoover Brunette Izel Hochberg, DO [facility] Pager [phone]  If 7PM-7AM, please contact night-coverage [email or web address] Password [email or web address] 04/21/2019, 11:56 AM

## 2019-04-22 DIAGNOSIS — J9601 Acute respiratory failure with hypoxia: Secondary | ICD-10-CM

## 2019-04-22 DIAGNOSIS — K219 Gastro-esophageal reflux disease without esophagitis: Secondary | ICD-10-CM

## 2019-04-22 DIAGNOSIS — U071 COVID-19: Principal | ICD-10-CM

## 2019-04-22 DIAGNOSIS — J1289 Other viral pneumonia: Secondary | ICD-10-CM

## 2019-04-22 LAB — CBC WITH DIFFERENTIAL/PLATELET
Abs Immature Granulocytes: 0.12 10*3/uL — ABNORMAL HIGH (ref 0.00–0.07)
Basophils Absolute: 0 10*3/uL (ref 0.0–0.1)
Basophils Relative: 0 %
Eosinophils Absolute: 0.1 10*3/uL (ref 0.0–0.5)
Eosinophils Relative: 1 %
HCT: 42 % (ref 36.0–46.0)
Hemoglobin: 13.8 g/dL (ref 12.0–15.0)
Immature Granulocytes: 2 %
Lymphocytes Relative: 42 %
Lymphs Abs: 2.9 10*3/uL (ref 0.7–4.0)
MCH: 30.7 pg (ref 26.0–34.0)
MCHC: 32.9 g/dL (ref 30.0–36.0)
MCV: 93.3 fL (ref 80.0–100.0)
Monocytes Absolute: 0.7 10*3/uL (ref 0.1–1.0)
Monocytes Relative: 11 %
Neutro Abs: 3 10*3/uL (ref 1.7–7.7)
Neutrophils Relative %: 44 %
Platelets: 370 10*3/uL (ref 150–400)
RBC: 4.5 MIL/uL (ref 3.87–5.11)
RDW: 12.2 % (ref 11.5–15.5)
WBC: 6.9 10*3/uL (ref 4.0–10.5)
nRBC: 0 % (ref 0.0–0.2)

## 2019-04-22 LAB — COMPREHENSIVE METABOLIC PANEL
ALT: 55 U/L — ABNORMAL HIGH (ref 0–44)
AST: 39 U/L (ref 15–41)
Albumin: 3.1 g/dL — ABNORMAL LOW (ref 3.5–5.0)
Alkaline Phosphatase: 66 U/L (ref 38–126)
Anion gap: 9 (ref 5–15)
BUN: 14 mg/dL (ref 6–20)
CO2: 23 mmol/L (ref 22–32)
Calcium: 8.3 mg/dL — ABNORMAL LOW (ref 8.9–10.3)
Chloride: 107 mmol/L (ref 98–111)
Creatinine, Ser: 0.68 mg/dL (ref 0.44–1.00)
GFR calc Af Amer: >60 mL/min (ref >60)
GFR calc non Af Amer: >60 mL/min (ref >60)
Glucose, Bld: 91 mg/dL (ref 70–99)
Potassium: 3.6 mmol/L (ref 3.5–5.1)
Sodium: 139 mmol/L (ref 135–145)
Total Bilirubin: 0.3 mg/dL (ref 0.3–1.2)
Total Protein: 6.2 g/dL — ABNORMAL LOW (ref 6.5–8.1)

## 2019-04-22 LAB — FERRITIN: Ferritin: 224 ng/mL (ref 11–307)

## 2019-04-22 LAB — D-DIMER, QUANTITATIVE: D-Dimer, Quant: 0.38 ug/mL-FEU (ref 0.00–0.50)

## 2019-04-22 LAB — GLUCOSE, CAPILLARY: Glucose-Capillary: 106 mg/dL — ABNORMAL HIGH (ref 70–99)

## 2019-04-22 LAB — C-REACTIVE PROTEIN: CRP: 2.3 mg/dL — ABNORMAL HIGH (ref <1.0)

## 2019-04-22 MED ORDER — ONDANSETRON 8 MG PO TBDP: 8.0000 mg | ORAL_TABLET | Freq: Three times a day (TID) | ORAL | 0 refills | Status: DC | PRN

## 2019-04-22 MED ORDER — GUAIFENESIN-CODEINE 100-10 MG/5ML PO SOLN: 10.0000 mL | Freq: Four times a day (QID) | ORAL | 0 refills | Status: DC | PRN

## 2019-04-22 MED ORDER — PREDNISONE 20 MG PO TABS: ORAL_TABLET | ORAL | 0 refills | Status: DC

## 2019-04-22 MED ORDER — PANTOPRAZOLE SODIUM 40 MG PO TBEC: 40.0000 mg | DELAYED_RELEASE_TABLET | Freq: Every day | ORAL | 1 refills | Status: DC

## 2019-04-22 NOTE — Plan of Care (Signed)

## 2019-04-22 NOTE — Progress Notes (Signed)
Patient unable to communicate effectively in [place]. [employer] video service utilized for assessment, updates and questions. Spanish Translator Fuller Song [phone] was utilized for communication with the patient at this time. Patient has indicated that she is experiencing nausea that started last night. Zofran injection 4 mg administered to patient per PRN order. Patient denies any pain, distress or discomfort other than an unproductive cough. All medications to be administered this morning were discussed with patient using Spanish translator services. All questions were answered and no further questions at this time by patient. Will continue to monitor patient.

## 2019-04-22 NOTE — Progress Notes (Signed)
Nsg Discharge Note  Admit Date:  04/18/2019 Discharge date: 04/22/2019   Molly Herman to be D/C'd Home  per MD order.  AVS completed.  Patient able to verbalize understanding.  Discharge Medication: Allergies as of 04/22/2019   No Known Allergies     Medication List    STOP taking these medications   benzonatate 100 MG capsule Commonly known as: TESSALON   docusate sodium 100 MG capsule Commonly known as: COLACE   feeding supplement (GLUCERNA SHAKE) Liqd   ibuprofen 200 MG tablet Commonly known as: ADVIL   methocarbamol 500 MG tablet Commonly known as: ROBAXIN   oxyCODONE 5 MG immediate release tablet Commonly known as: Oxy IR/ROXICODONE   polyethylene glycol 17 g packet Commonly known as: MIRALAX / GLYCOLAX   traMADol 50 MG tablet Commonly known as: ULTRAM     TAKE these medications   acetaminophen 325 MG tablet Commonly known as: TYLENOL Take 1-2 tablets (325-650 mg total) by mouth every 6 (six) hours as needed for fever, headache, mild pain or moderate pain.   guaiFENesin-codeine 100-10 MG/5ML syrup Take 10 mLs by mouth every 6 (six) hours as needed for cough.   ondansetron 8 MG disintegrating tablet Commonly known as: Zofran ODT Take 1 tablet (8 mg total) by mouth every 8 (eight) hours as needed for nausea or vomiting. 4mg  ODT q4 hours prn nausea/vomit What changed:   medication strength  how much to take  how to take this  when to take this  reasons to take this   pantoprazole 40 MG tablet Commonly known as: Protonix Take 1 tablet (40 mg total) by mouth daily.   predniSONE 20 MG tablet Commonly known as: Deltasone Take 3 tabs by mouth daily X 2 days; then 2 tabs by mouth daily X 2 days; then 1 tab my mouth daily X 2 days; then 1/2 tab my mouth daily X 3 days and stop prednisone.       Discharge Assessment: Vitals:   04/22/19 0400 04/22/19 1506  BP: 92/65 107/76  Pulse:    Resp:  16  Temp: 98.3 F (36.8 C) 98 F (36.7 C)   SpO2: 98% 97%   Skin clean, dry and intact without evidence of skin break down, no evidence of skin tears noted. IV catheter discontinued intact. Site without signs and symptoms of complications - no redness or edema noted at insertion site, patient denies c/o pain - only slight tenderness at site.  Dressing with slight pressure applied.  D/c Instructions-Education: Discharge instructions given to patient with verbalized understanding. Spanish Interpretor services used for discharge instructions and medications. Molly Herman # [phone] via Molly Herman video.  D/c education completed with patient including follow up instructions, medication list, d/c activities limitations if indicated, with other d/c instructions as indicated by MD - patient able to verbalize understanding, all questions fully answered. Patient instructed to return to ED, call 911, or call MD for any changes in condition.  Patient escorted via [place], and D/C home via private auto.  Molly Espino Loletha Grayer, RN 04/22/2019 3:07 PM

## 2019-04-22 NOTE — Discharge Summary (Signed)
Physician Discharge Summary  Molly Herman ZOX:[id number] DOB: 30-May-1975 DOA: 04/18/2019  PCP: Patient, No Pcp Per  Admit date: 04/18/2019 Discharge date: 04/22/2019  Time spent: 35 minutes  Recommendations for Outpatient Follow-up:  1. Repeat BMET to follow electrolytes and renal function.  2. Reassess resolution of acute COVID symptoms.  3. Repeat CXR in 6-8 weeks to assure resolution of infiltrates.    Discharge Diagnoses:  Principal Problem:   Pneumonia due to COVID-19 virus Active Problems:   Acute respiratory failure with hypoxia (HCC)   Gastroesophageal reflux disease   Discharge Condition: Stable and improved. Discharge home with instructions to follow up with PCP in 10 days.   Diet recommendation: regular diet.   Filed Weights   04/18/19 2151  Weight: 76 kg    History of present illness:  As per H&P written by Dr. Mariea Clonts on 04/18/19 43 y.o. female with medical history significant for gestational diabetes mellitus, traumatic brain injury.  Patient is Spanish-speaking with limited English but able to respond to simple questions.  Presented to the ED with complaints of cough and difficulty breathing, with fevers at home for the past week.  Patient was seen in the ED 12/20 for similar symptoms, she had had a Covid test done 2 days prior, she was still awaiting the results.  Patient reports multiple coworkers have Covid 19 infection.  Hospital Course:  1-acute hypoxemic resp failure due to COVID-19 PNA -improved and no requiring O2 at discharge -patient completed 5 days of remdesivir and will go home with instructions for steroids tapering. -follow up with PCP in 10 minutes -continue incentive spirometry -continue PRN antitussive  2-GERD -continue PPI   Procedures:  See below for x-ray reports.   Consultations:  None   Discharge Exam: Vitals:   04/21/19 1446 04/22/19 0400  BP: 104/71 92/65  Pulse: 63   Resp: 14   Temp: 98.4 F (36.9 C)  98.3 F (36.8 C)  SpO2: 98% 98%    General: afebrile, no CP, no requiring O2 supplementation and no vomiting. Patient reports mild nausea, otherwise feeling ready to go home.  Cardiovascular: RRR, no rubs, no gallops, no murmur, no JVD Respiratory: no using accessory muscles, normal resp effort, improved air movement bilaterally, no wheezing.  Abd: soft, NT, ND, positive BS.  Discharge Instructions   Discharge Instructions    Discharge instructions   Complete by: As directed    Follow up with PCP in 10 days Continue to follow 3 W's as discussed Maintain adequate hydration Take medications as prescribed     Allergies as of 04/22/2019   No Known Allergies     Medication List    STOP taking these medications   benzonatate 100 MG capsule Commonly known as: TESSALON   docusate sodium 100 MG capsule Commonly known as: COLACE   feeding supplement (GLUCERNA SHAKE) Liqd   ibuprofen 200 MG tablet Commonly known as: ADVIL   methocarbamol 500 MG tablet Commonly known as: ROBAXIN   oxyCODONE 5 MG immediate release tablet Commonly known as: Oxy IR/ROXICODONE   polyethylene glycol 17 g packet Commonly known as: MIRALAX / GLYCOLAX   traMADol 50 MG tablet Commonly known as: ULTRAM     TAKE these medications   acetaminophen 325 MG tablet Commonly known as: TYLENOL Take 1-2 tablets (325-650 mg total) by mouth every 6 (six) hours as needed for fever, headache, mild pain or moderate pain.   guaiFENesin-codeine 100-10 MG/5ML syrup Take 10 mLs by mouth every 6 (six) hours as needed  for cough.   ondansetron 8 MG disintegrating tablet Commonly known as: Zofran ODT Take 1 tablet (8 mg total) by mouth every 8 (eight) hours as needed for nausea or vomiting. 4mg  ODT q4 hours prn nausea/vomit What changed:   medication strength  how much to take  how to take this  when to take this  reasons to take this   pantoprazole 40 MG tablet Commonly known as: Protonix Take 1  tablet (40 mg total) by mouth daily.   predniSONE 20 MG tablet Commonly known as: Deltasone Take 3 tabs by mouth daily X 2 days; then 2 tabs by mouth daily X 2 days; then 1 tab my mouth daily X 2 days; then 1/2 tab my mouth daily X 3 days and stop prednisone.      No Known Allergies    The results of significant diagnostics from this hospitalization (including imaging, microbiology, ancillary and laboratory) are listed below for reference.    Significant Diagnostic Studies: DG Chest Port 1 View  Result Date: 04/18/2019 CLINICAL DATA:  Shortness of breath, possible COVID-19 infection. EXAM: PORTABLE CHEST 1 VIEW COMPARISON:  Chest x-rays dated 04/15/2019 and 05/01/2015. FINDINGS: Patchy bilateral airspace opacities, worsened compared to the earlier chest x-ray of 04/15/2019. No pleural effusion or pneumothorax is seen. Heart size and mediastinal contours are within normal limits. Osseous structures about the chest are unremarkable. IMPRESSION: Patchy bilateral airspace opacities, worsened compared to the earlier chest x-ray of 04/15/2019, most likely multifocal pneumonia. Electronically Signed   By: Franki Cabot M.D.   On: 04/18/2019 13:04   DG Chest Port 1 View  Result Date: 04/15/2019 CLINICAL DATA:  Cough, chills, and body aches. EXAM: PORTABLE CHEST 1 VIEW COMPARISON:  CT chest and chest x-ray dated May 01, 2015. FINDINGS: The heart size and mediastinal contours are within normal limits. Normal pulmonary vascularity. Low lung volumes with mild bilateral patchy peripheral opacities. No pleural effusion or pneumothorax. No acute osseous abnormality. IMPRESSION: There are findings in the lungs which are nonspecific, but concerning for atypical infection, including potential viral pneumonia. Electronically Signed   By: Titus Dubin M.D.   On: 04/15/2019 09:01    Microbiology: Recent Results (from the past 240 hour(s))  Blood Culture (routine x 2)     Status: None (Preliminary  result)   Collection Time: 04/18/19 12:43 PM   Specimen: Right Antecubital; Blood  Result Value Ref Range Status   Specimen Description RIGHT ANTECUBITAL  Final   Special Requests   Final    Blood Culture adequate volume BOTTLES DRAWN AEROBIC AND ANAEROBIC   Culture   Final    NO GROWTH 4 DAYS Performed at [facility], [address], [place], [place] [address]    Report Status PENDING  Incomplete  Blood Culture (routine x 2)     Status: None (Preliminary result)   Collection Time: 04/18/19  2:03 PM   Specimen: Site Not Specified; Blood  Result Value Ref Range Status   Specimen Description SITE NOT SPECIFIED  Final   Special Requests   Final    BOTTLES DRAWN AEROBIC AND ANAEROBIC Blood Culture adequate volume   Culture   Final    NO GROWTH 4 DAYS Performed at [facility], [address] [place], [place] [address]    Report Status PENDING  Incomplete  Respiratory Panel by RT PCR (Flu A&B, Covid) - Nasopharyngeal Swab     Status: Abnormal   Collection Time: 04/18/19  4:11 PM   Specimen: Nasopharyngeal Swab  Result Value Ref Range Status   SARS Coronavirus 2 by RT PCR POSITIVE (A) NEGATIVE Final    Comment: RESULT CALLED TO, READ BACK BY AND VERIFIED WITH: MARTIN,D AT 1704 ON 12.23.20 BY ISLEY,B (NOTE) SARS-CoV-2 target nucleic acids are DETECTED. SARS-CoV-2 RNA is generally detectable in upper respiratory specimens  during the acute phase of infection. Positive results are indicative of the presence of the identified virus, but do not rule out bacterial infection or co-infection with other pathogens not detected by the test. Clinical correlation with patient history and other diagnostic information is necessary to determine patient infection status. The expected result is Negative. Fact Sheet for Patients:  https://www.moore.com/https://www.fda.gov/media/142436/download Fact Sheet for Healthcare Providers: https://www.young.biz/https://www.fda.gov/media/142435/download This test is not yet approved or cleared by the  [place] FDA and  has been authorized for detection and/or diagnosis of SARS-CoV-2 by FDA under an Emergency Use Authorization (EUA).  This EUA will remain in effect (meaning this test can be use d) for the duration of  the COVID-19 declaration under Section 564(b)(1) of the Act, 21 U.S.C. section 360bbb-3(b)(1), unless the authorization is terminated or revoked sooner.    Influenza A by PCR NEGATIVE NEGATIVE Final   Influenza B by PCR NEGATIVE NEGATIVE Final    Comment: (NOTE) The Xpert Xpress SARS-CoV-2/FLU/RSV assay is intended as an aid in  the diagnosis of influenza from Nasopharyngeal swab specimens and  should not be used as a sole basis for treatment. Nasal washings and  aspirates are unacceptable for Xpert Xpress SARS-CoV-2/FLU/RSV  testing. Fact Sheet for Patients: https://www.moore.com/https://www.fda.gov/media/142436/download Fact Sheet for Healthcare Providers: https://www.young.biz/https://www.fda.gov/media/142435/download This test is not yet approved or cleared by the [place] FDA and  has been authorized for detection and/or diagnosis of SARS-CoV-2 by  FDA under an Emergency Use Authorization (EUA). This EUA will remain  in effect (meaning this test can be used) for the duration of the  Covid-19 declaration under Section 564(b)(1) of the Act, 21  U.S.C. section 360bbb-3(b)(1), unless the authorization is  terminated or revoked. Performed at [facility], [address] [place], [place] [address]      Labs: Basic Metabolic Panel: Recent Labs  Lab 04/18/19 1238 04/19/19 0711 04/20/19 0658 04/21/19 0752 04/22/19 0740  NA 136 138 140 137 139  K 3.8 4.0 3.8 3.9 3.6  CL 103 104 106 105 107  CO2 24 24 24 24 23   GLUCOSE 98 124* 98 97 91  BUN 10 11 13 13 14   CREATININE 0.72 0.56 0.60 0.61 0.68  CALCIUM 8.7* 8.8* 8.9 8.7* 8.3*   Liver Function Tests: Recent Labs  Lab 04/18/19 1238 04/19/19 0711 04/20/19 0658 04/21/19 0752 04/22/19 0740  AST 44* 34 27 39 39  ALT 45* 40 37 51* 55*   ALKPHOS 81 72 66 62 66  BILITOT 0.6 0.5 0.3 0.4 0.3  PROT 7.5 7.1 6.8 6.6 6.2*  ALBUMIN 3.6 3.4* 3.3* 3.3* 3.1*   CBC: Recent Labs  Lab 04/18/19 1238 04/19/19 0711 04/20/19 0658 04/21/19 0752 04/22/19 0740  WBC 4.0 3.0* 5.9 6.1 6.9  NEUTROABS 2.0 1.4* 2.9 2.8 3.0  HGB 14.9 13.4 13.6 13.5 13.8  HCT 44.7 40.5 41.3 41.0 42.0  MCV 93.9 93.3 94.3 93.6 93.3  PLT 258 287 344 360 370   CBG: Recent Labs  Lab 04/19/19 0721 04/19/19 1111 04/19/19 1607 04/20/19 0647 04/22/19 0502  GLUCAP 116* 114* 194* 109* 106*    Signed:  Vassie Lollarlos Tyrece Vanterpool MD.  [facility] 04/22/2019, 2:07 PM

## 2019-04-23 LAB — CULTURE, BLOOD (ROUTINE X 2)
Culture: NO GROWTH
Culture: NO GROWTH
Special Requests: ADEQUATE
Special Requests: ADEQUATE

## 2019-10-02 ENCOUNTER — Other Ambulatory Visit (HOSPITAL_COMMUNITY): Payer: Self-pay | Admitting: Nurse Practitioner

## 2019-10-02 DIAGNOSIS — Z1231 Encounter for screening mammogram for malignant neoplasm of breast: Secondary | ICD-10-CM

## 2019-11-05 ENCOUNTER — Ambulatory Visit (HOSPITAL_COMMUNITY)
Admission: RE | Admit: 2019-11-05 | Discharge: 2019-11-05 | Disposition: A | Discharge status: Home / Self Care | Payer: Self-pay | Source: Ambulatory Visit | Attending: Nurse Practitioner | Admitting: Nurse Practitioner

## 2019-11-05 ENCOUNTER — Other Ambulatory Visit: Payer: Self-pay

## 2019-11-05 DIAGNOSIS — Z1231 Encounter for screening mammogram for malignant neoplasm of breast: Secondary | ICD-10-CM | POA: Insufficient documentation

## 2020-03-12 ENCOUNTER — Other Ambulatory Visit (HOSPITAL_COMMUNITY): Payer: Self-pay | Admitting: *Deleted

## 2020-03-12 DIAGNOSIS — R1013 Epigastric pain: Secondary | ICD-10-CM

## 2020-03-19 ENCOUNTER — Ambulatory Visit (HOSPITAL_COMMUNITY)
Admission: RE | Admit: 2020-03-19 | Discharge: 2020-03-19 | Disposition: A | Discharge status: Home / Self Care | Payer: Self-pay | Source: Ambulatory Visit | Attending: *Deleted | Admitting: *Deleted

## 2020-03-19 ENCOUNTER — Other Ambulatory Visit: Payer: Self-pay

## 2020-03-19 DIAGNOSIS — R1013 Epigastric pain: Secondary | ICD-10-CM

## 2020-10-30 ENCOUNTER — Other Ambulatory Visit (HOSPITAL_COMMUNITY): Payer: Self-pay | Admitting: Nurse Practitioner

## 2020-10-30 DIAGNOSIS — Z1231 Encounter for screening mammogram for malignant neoplasm of breast: Secondary | ICD-10-CM

## 2020-10-31 ENCOUNTER — Other Ambulatory Visit: Payer: Self-pay | Admitting: Nurse Practitioner

## 2020-10-31 ENCOUNTER — Other Ambulatory Visit (HOSPITAL_COMMUNITY): Payer: Self-pay | Admitting: Nurse Practitioner

## 2020-10-31 DIAGNOSIS — N921 Excessive and frequent menstruation with irregular cycle: Secondary | ICD-10-CM

## 2020-10-31 DIAGNOSIS — N926 Irregular menstruation, unspecified: Secondary | ICD-10-CM

## 2020-11-12 ENCOUNTER — Ambulatory Visit (HOSPITAL_COMMUNITY): Payer: Self-pay

## 2020-11-24 ENCOUNTER — Ambulatory Visit (HOSPITAL_COMMUNITY)
Admission: RE | Admit: 2020-11-24 | Discharge: 2020-11-24 | Disposition: A | Discharge status: Home / Self Care | Payer: Self-pay | Source: Ambulatory Visit | Attending: Nurse Practitioner | Admitting: Nurse Practitioner

## 2020-11-24 ENCOUNTER — Other Ambulatory Visit: Payer: Self-pay

## 2020-11-24 DIAGNOSIS — Z1231 Encounter for screening mammogram for malignant neoplasm of breast: Secondary | ICD-10-CM | POA: Insufficient documentation

## 2020-11-27 ENCOUNTER — Encounter (HOSPITAL_COMMUNITY): Payer: Self-pay

## 2020-11-27 ENCOUNTER — Ambulatory Visit
Admission: RE | Admit: 2020-11-27 | Discharge: 2020-11-27 | Disposition: A | Discharge status: Home / Self Care | Payer: Self-pay | Source: Ambulatory Visit | Attending: Nurse Practitioner | Admitting: Nurse Practitioner

## 2020-11-27 ENCOUNTER — Other Ambulatory Visit: Payer: Self-pay

## 2020-11-27 ENCOUNTER — Ambulatory Visit (HOSPITAL_COMMUNITY)
Admission: RE | Admit: 2020-11-27 | Discharge: 2020-11-27 | Disposition: A | Discharge status: Home / Self Care | Payer: Self-pay | Source: Ambulatory Visit | Attending: Nurse Practitioner | Admitting: Nurse Practitioner

## 2020-11-27 DIAGNOSIS — N921 Excessive and frequent menstruation with irregular cycle: Secondary | ICD-10-CM

## 2020-11-27 DIAGNOSIS — N926 Irregular menstruation, unspecified: Secondary | ICD-10-CM

## 2020-11-28 ENCOUNTER — Other Ambulatory Visit (HOSPITAL_COMMUNITY): Payer: Self-pay | Admitting: Nurse Practitioner

## 2020-12-02 ENCOUNTER — Other Ambulatory Visit (HOSPITAL_COMMUNITY)
Admission: RE | Admit: 2020-12-02 | Discharge: 2020-12-02 | Disposition: A | Discharge status: Home / Self Care | Payer: Self-pay | Source: Ambulatory Visit | Attending: Nurse Practitioner | Admitting: Nurse Practitioner

## 2020-12-02 ENCOUNTER — Other Ambulatory Visit (HOSPITAL_COMMUNITY): Payer: Self-pay | Admitting: Nurse Practitioner

## 2020-12-02 DIAGNOSIS — R928 Other abnormal and inconclusive findings on diagnostic imaging of breast: Secondary | ICD-10-CM

## 2020-12-02 DIAGNOSIS — R87610 Atypical squamous cells of undetermined significance on cytologic smear of cervix (ASC-US): Secondary | ICD-10-CM | POA: Insufficient documentation

## 2020-12-05 LAB — SURGICAL PATHOLOGY

## 2020-12-18 ENCOUNTER — Ambulatory Visit (HOSPITAL_COMMUNITY)
Admission: RE | Admit: 2020-12-18 | Discharge: 2020-12-18 | Disposition: A | Discharge status: Home / Self Care | Payer: Self-pay | Source: Ambulatory Visit | Attending: Nurse Practitioner | Admitting: Nurse Practitioner

## 2020-12-18 ENCOUNTER — Other Ambulatory Visit: Payer: Self-pay

## 2020-12-18 DIAGNOSIS — R928 Other abnormal and inconclusive findings on diagnostic imaging of breast: Secondary | ICD-10-CM

## 2021-01-22 ENCOUNTER — Encounter: Payer: Self-pay | Admitting: Obstetrics & Gynecology

## 2021-01-22 ENCOUNTER — Ambulatory Visit: Payer: Self-pay | Admitting: Obstetrics & Gynecology

## 2021-01-22 ENCOUNTER — Other Ambulatory Visit: Payer: Self-pay

## 2021-01-22 VITALS — BP 121/81 | HR 66 | Ht <= 58 in | Wt 173.0 lb

## 2021-01-22 DIAGNOSIS — R87613 High grade squamous intraepithelial lesion on cytologic smear of cervix (HGSIL): Secondary | ICD-10-CM

## 2021-01-22 NOTE — Progress Notes (Signed)
Follow up appointment for results  Chief Complaint  Patient presents with   referral    Possible Leep    Blood pressure 121/81, pulse 66, height 4\' 10"  (1.473 m), weight 173 lb (78.5 kg).  Cervical biopsies High grade cervical dysplasia  Recommend laser ablation of the cervix  MEDS ordered this encounter: No orders of the defined types were placed in this encounter.   Orders for this encounter: No orders of the defined types were placed in this encounter.   Impression: High grade dyspalsia  Plan: Laser ablation of the cervix 02/04/21  Follow Up: Return in about 25 days (around 02/16/2021) for Post Op, with Dr 02/18/2021.     All questions were answered.  Past Medical History:  Diagnosis Date   Diabetes mellitus without complication (HCC)    Gestational w/ last pregnancy    History reviewed. No pertinent surgical history.  OB History     Gravida  4   Para  4   Term  4   Preterm      AB      Living         SAB      IAB      Ectopic      Multiple      Live Births              No Known Allergies  Social History   Socioeconomic History   Marital status: Single    Spouse name: Not on file   Number of children: Not on file   Years of education: Not on file   Highest education level: Not on file  Occupational History   Not on file  Tobacco Use   Smoking status: Never   Smokeless tobacco: Never  Vaping Use   Vaping Use: Never used  Substance and Sexual Activity   Alcohol use: Never    Alcohol/week: 0.0 standard drinks   Drug use: Never   Sexual activity: Not Currently  Other Topics Concern   Not on file  Social History Narrative   Not on file   Social Determinants of Health   Financial Resource Strain: Not on file  Food Insecurity: No Food Insecurity   Worried About Running Out of Food in the Last Year: Never true   Ran Out of Food in the Last Year: Never true  Transportation Needs: No Transportation Needs   Lack of  Transportation (Medical): No   Lack of Transportation (Non-Medical): No  Physical Activity: Sufficiently Active   Days of Exercise per Week: 3 days   Minutes of Exercise per Session: 50 min  Stress: No Stress Concern Present   Feeling of Stress : Only a little  Social Connections: Moderately Isolated   Frequency of Communication with Friends and Family: Twice a week   Frequency of Social Gatherings with Friends and Family: Twice a week   Attends Religious Services: More than 4 times per year   Active Member of [employer] or Organizations: No   Attends [employer] Meetings: Never   Marital Status: Separated    History reviewed. No pertinent family history.

## 2021-01-29 NOTE — Patient Instructions (Addendum)
Instrucciones Para Antes de la Ciruga   Su ciruga est programada para-(your procedure is scheduled on) 02/04/2021 @ 0950 AM.   Molly Herman - (enter)    Por favor llame al [phone] si tiene algn problema en la maana de la ciruga. (please call if you have any problems the morning of surgery.)                  Recuerde: (Remember)   No Food after midnight 02/03/2021. You may have clear liquids until 0700 AM 02/04/2021. At 0700 02/04/2021, drink your carb drink and them nothing else to drink after this.   Tome estas medicinas en la maana de la ciruga con un SORBITO de agua (take these meds the morning of surgery with a SIP of water)            None   Puede cepillarse los dientes en la maana de la [place]. (you may brush your teeth the morning of surgery)   No use joyas, maquillaje de ojos, lpiz labial, crema para el cuerpo o esmalte de uas oscuro. (Do not wear jewelry, eye makeup, lipstick, body lotion, or dark fingernail polish)   No puede usar desodorante. (you may wear deodorant)   Si va a ser ingresado despues de la ciruga, deje la maleta en el carro hasta que se le haya asignado una habitacin. (If you are to be admitted after surgery, leave suitcase in car until your room has been assigned.)   A los pacientes que se les d de alta el mismo da no se les permitir manejar a casa.  (Patients discharged on the day of surgery will not be allowed to drive home)   Use ropa suelta y cmoda de regreso a casa. (wear loose comfortable clothes for ride home)                  Conizacin, cuidados posteriores Cervical Conization, Care After Esta hoja le brinda informacin sobre cmo cuidarse despus del procedimiento. El mdico tambin podr darle instrucciones ms especficas. Si tiene problemas o preguntas, llame al mdico. Qu puedo esperar despus del procedimiento? Despus del procedimiento, es normal tener los  siguientes sntomas: Sentirse atontada si le administraron un medicamento para Charity fundraiser dormir (anestesia general). Clicos similares a los menstruales. Secrecin vaginal sanguinolenta o sangrado leve a moderado. Lquido oscuro que sale de la vagina (secrecin vaginal). Este lquido se asemeja al poso del caf. Siga estas instrucciones en su casa: Medicamentos Use los medicamentos de venta libre y los recetados solamente como se lo haya indicado el mdico. No tome aspirina hasta que el mdico lo autorice. Actividad  Haga reposo como se lo haya indicado el mdico. Durante 7 a 14 das despus del procedimiento, evite lo siguiente: El exceso de [place]. Realizar actividad fsica. Levantar peso excesivo. No levante ningn objeto que pese ms de 10 libras (4,5 kg) o el lmite de peso que le indiquen hasta que el mdico le diga que puede [place]. Retome sus actividades normales segn lo indicado por el mdico. Pregntele al mdico qu actividades son seguras para usted. Instrucciones generales  Puede retomar su dieta habitual, a menos que el mdico le indique que no lo haga. Beba suficiente lquido para  mantener el pis (la orina ) de color amarillo plido. No se d baos de inmersin, no nade ni use el jacuzzi hasta que el mdico lo apruebe. Pregunte al mdico si puede ducharse. Delle Reining solo le permitan darse baos de [place]. No se haga duchas vaginales, no tenga relaciones sexuales ni se ponga nada en la vagina, ni siquiera tampones, hasta que el mdico la autorice. Concurra a todas visitas de seguimiento como se lo haya indicado el mdico. Esto es importante. Comunquese con un mdico si: Aparece una erupcin cutnea. Se siente mareada o aturdida. Tiene ganas de vomitar (nuseas). Vomita. Tiene un lquido con mal [employer] de la vagina. Solicite ayuda de inmediato si: Tiene grumos (cogulos) de sangre que salen de la vagina. Tiene ms sangrado del que tendra en un perodo  menstrual normal. Por ejemplo, empapa un apsito en menos de 1 hora. Tiene fiebre. Tiene cada vez ms clicos. Pierde el conocimiento (se desmaya). Siente dolor al [employer]. Tiene un dolor muy intenso o el dolor [place]. Los medicamentos no Tourist information centre manager. Observa sangre en la orina. Resumen Despus del procedimiento, es normal tener clicos, sangrado leve y lquido de color oscuro o con sangre que sale de la vagina. No se haga duchas vaginales, no use tampones ni tenga relaciones sexuales hasta que el mdico la autorice. Durante aproximadamente 7 a 14 das despus del procedimiento, trate de no hacer ejercicio ni levantar objetos pesados. Esta informacin no tiene Theme park manager el consejo del mdico. Asegrese de hacerle al mdico cualquier pregunta que tenga. Document Revised: 11/29/2018 Document Reviewed: 11/29/2018 Elsevier Patient Education  2022 Elsevier Inc. Anestesia general en adultos, cuidados posteriores General Anesthesia, Adult, Care After Esta hoja le brinda informacin sobre cmo cuidarse despus del procedimiento. El mdico tambin podr darle instrucciones ms especficas. Comunquese con el mdico si tiene problemas o preguntas. Qu puedo esperar despus del procedimiento? Luego del procedimiento, son comunes los siguiente efectos secundarios: Dolor o Associate Professor en el lugar de la va intravenosa (i.v.). Nuseas. Vmitos. El dolor de Advertising copywriter. Dificultad para concentrarte. Sentir fro o [employer] Sensacin de debilidad o cansancio. Somnolencia y Management consultant. Malestar y Tourist information centre manager. Estos efectos secundarios pueden afectar partes del cuerpo que no estuvieron involucradas en la ciruga. Siga estas instrucciones en su casa: Durante el perodo de [employer] le haya indicado el mdico:  Descanse. No participe en actividades que impliquen posibles cadas o lesiones. No conduzca ni opere maquinaria. No beba alcohol. No tome somnferos ni medicamentos que  causen somnolencia. No tome decisiones trascendentes ni firme documentos importantes. No cuide a nios por su cuenta. Comida y bebida Siga las indicaciones del mdico respecto de las restricciones de comidas o bebidas. Cuando [employer], comience a comer cantidades pequeas de alimentos que sean blandos y fciles de Location manager (livianos), como una tostada. Retome su dieta habitual de forma gradual. Beber suficiente lquido como para mantener la orina de color amarillo plido. Si vomita, rehidrtese tomando agua, jugo o caldo transparente. Instrucciones generales Si tiene apnea del sueo, la [place] y ciertos medicamentos pueden elevar su riesgo de tener problemas respiratorios. Siga las instrucciones del mdico respecto al uso del dispositivo para dormir: Siempre que duerma, incluso durante las siestas que tome en [employer]. Mientras tome analgsicos recetados, medicamentos para dormir o medicamentos que producen somnolencia. Pida a un adulto responsable que se quede con usted durante el tiempo que se le indique. Es importante tener a alguien que lo ayude a cuidarse hasta que est despierto  y consciente. Retome sus actividades normales segn lo indicado por el mdico. Pregntele al mdico qu actividades son seguras para usted. Use los medicamentos de venta libre y los recetados solamente como se lo haya indicado el mdico. Si fuma, no lo haga sin supervisin. Concurra a todas las visitas de [address] se lo haya indicado el mdico. Esto es importante. Comunquese con un mdico si: Tiene nuseas o vmitos que no mejoran con medicamentos. No puede comer ni beber sin vomitar. Su dolor no se alivia con medicamentos. No puede orinar. Tiene una erupcin cutnea. Tiene fiebre. Presenta enrojecimiento alrededor del lugar de la va intravenosa (i.v.) que empeora. Solicite ayuda de inmediato si: Tienes dificultad para respirar. Sientes dolor en el pecho. Observa sangre en la orina o heces, o  vomita sangre. Resumen Despus del procedimiento, es comn tener dolor de garganta y nuseas. Tambin es comn sentirse cansado. Pida a un adulto responsable que se quede con usted durante el tiempo que se le indique. Es importante tener a alguien que lo ayude a cuidarse hasta que est despierto y Research officer, political party. Cuando [employer], comience a comer cantidades pequeas de alimentos que sean blandos y fciles de Location manager (livianos), como una tostada. Retome su dieta habitual de forma gradual. Beber suficiente lquido como para mantener la orina de color amarillo plido. Retome sus actividades normales segn lo indicado por el mdico. Pregntele al mdico qu actividades son seguras para usted. Esta informacin no tiene Theme park manager el consejo del mdico. Asegrese de hacerle al mdico cualquier pregunta que tenga. Document Revised: 10/04/2019 Document Reviewed: 10/04/2019 Elsevier Patient Education  2022 [employer].

## 2021-02-01 ENCOUNTER — Other Ambulatory Visit: Payer: Self-pay | Admitting: Obstetrics & Gynecology

## 2021-02-01 DIAGNOSIS — Z01818 Encounter for other preprocedural examination: Secondary | ICD-10-CM

## 2021-02-02 ENCOUNTER — Encounter (HOSPITAL_COMMUNITY)
Admission: RE | Admit: 2021-02-02 | Discharge: 2021-02-02 | Disposition: A | Discharge status: Home / Self Care | Payer: Self-pay | Source: Ambulatory Visit | Attending: Obstetrics & Gynecology | Admitting: Obstetrics & Gynecology

## 2021-02-02 DIAGNOSIS — Z01818 Encounter for other preprocedural examination: Secondary | ICD-10-CM

## 2021-02-02 DIAGNOSIS — Z01812 Encounter for preprocedural laboratory examination: Secondary | ICD-10-CM | POA: Insufficient documentation

## 2021-02-02 LAB — URINALYSIS, ROUTINE W REFLEX MICROSCOPIC
Bilirubin Urine: NEGATIVE
Glucose, UA: NEGATIVE mg/dL
Hgb urine dipstick: NEGATIVE
Ketones, ur: NEGATIVE mg/dL
Leukocytes,Ua: NEGATIVE
Nitrite: NEGATIVE
Protein, ur: NEGATIVE mg/dL
Specific Gravity, Urine: 1.006 (ref 1.005–1.030)
pH: 7 (ref 5.0–8.0)

## 2021-02-02 LAB — COMPREHENSIVE METABOLIC PANEL
ALT: 17 U/L (ref 0–44)
AST: 17 U/L (ref 15–41)
Albumin: 4.1 g/dL (ref 3.5–5.0)
Alkaline Phosphatase: 73 U/L (ref 38–126)
Anion gap: 5 (ref 5–15)
BUN: 7 mg/dL (ref 6–20)
CO2: 26 mmol/L (ref 22–32)
Calcium: 8.7 mg/dL — ABNORMAL LOW (ref 8.9–10.3)
Chloride: 106 mmol/L (ref 98–111)
Creatinine, Ser: 0.58 mg/dL (ref 0.44–1.00)
GFR, Estimated: >60 mL/min (ref >60)
Glucose, Bld: 107 mg/dL — ABNORMAL HIGH (ref 70–99)
Potassium: 3.7 mmol/L (ref 3.5–5.1)
Sodium: 137 mmol/L (ref 135–145)
Total Bilirubin: 0.7 mg/dL (ref 0.3–1.2)
Total Protein: 7.2 g/dL (ref 6.5–8.1)

## 2021-02-02 LAB — RAPID HIV SCREEN (HIV 1/2 AB+AG)
HIV 1/2 Antibodies: NONREACTIVE
HIV-1 P24 Antigen - HIV24: NONREACTIVE

## 2021-02-02 LAB — CBC
HCT: 41.9 % (ref 36.0–46.0)
Hemoglobin: 14.2 g/dL (ref 12.0–15.0)
MCH: 32.4 pg (ref 26.0–34.0)
MCHC: 33.9 g/dL (ref 30.0–36.0)
MCV: 95.7 fL (ref 80.0–100.0)
Platelets: 275 10*3/uL (ref 150–400)
RBC: 4.38 MIL/uL (ref 3.87–5.11)
RDW: 12.4 % (ref 11.5–15.5)
WBC: 5.8 10*3/uL (ref 4.0–10.5)
nRBC: 0 % (ref 0.0–0.2)

## 2021-02-02 LAB — HEMOGLOBIN A1C
Hgb A1c MFr Bld: 5.7 % — ABNORMAL HIGH (ref 4.8–5.6)
Mean Plasma Glucose: 116.89 mg/dL

## 2021-02-02 LAB — HCG, QUANTITATIVE, PREGNANCY: hCG, Beta Chain, Quant, S: <1 m[IU]/mL (ref <5)

## 2021-02-04 ENCOUNTER — Ambulatory Visit (HOSPITAL_COMMUNITY): Payer: Self-pay | Admitting: Anesthesiology

## 2021-02-04 ENCOUNTER — Ambulatory Visit (HOSPITAL_COMMUNITY)
Admission: RE | Admit: 2021-02-04 | Discharge: 2021-02-04 | Disposition: A | Discharge status: Home / Self Care | Payer: Self-pay | Source: Ambulatory Visit | Attending: Obstetrics & Gynecology | Admitting: Obstetrics & Gynecology

## 2021-02-04 ENCOUNTER — Encounter (HOSPITAL_COMMUNITY)
Admission: RE | Disposition: A | Discharge status: Home / Self Care | Payer: Self-pay | Source: Ambulatory Visit | Attending: Obstetrics & Gynecology

## 2021-02-04 ENCOUNTER — Other Ambulatory Visit: Payer: Self-pay

## 2021-02-04 ENCOUNTER — Encounter (HOSPITAL_COMMUNITY): Payer: Self-pay | Admitting: Obstetrics & Gynecology

## 2021-02-04 DIAGNOSIS — E119 Type 2 diabetes mellitus without complications: Secondary | ICD-10-CM | POA: Insufficient documentation

## 2021-02-04 DIAGNOSIS — R87613 High grade squamous intraepithelial lesion on cytologic smear of cervix (HGSIL): Secondary | ICD-10-CM | POA: Insufficient documentation

## 2021-02-04 HISTORY — PX: LASER ABLATION CONDOLAMATA: SHX5941

## 2021-02-04 LAB — GLUCOSE, CAPILLARY
Glucose-Capillary: 97 mg/dL (ref 70–99)
Glucose-Capillary: 104 mg/dL — ABNORMAL HIGH (ref 70–99)

## 2021-02-04 SURGERY — ABLATION, CONDYLOMA, USING LASER
Anesthesia: General | Site: Cervix

## 2021-02-04 MED ORDER — MIDAZOLAM HCL 2 MG/2ML IJ SOLN
INTRAMUSCULAR | Status: DC | PRN
Start: 2021-02-04 — End: 2021-02-04
  Administered 2021-02-04: 2 mg via INTRAVENOUS

## 2021-02-04 MED ORDER — DEXAMETHASONE SODIUM PHOSPHATE 10 MG/ML IJ SOLN
INTRAMUSCULAR | Status: DC | PRN
Start: 2021-02-04 — End: 2021-02-04
  Administered 2021-02-04: 10 mg via INTRAVENOUS

## 2021-02-04 MED ORDER — CHLORHEXIDINE GLUCONATE 0.12 % MT SOLN
OROMUCOSAL | Status: AC
  Administered 2021-02-04: 15 mL via OROMUCOSAL
  Filled 2021-02-04: qty 15

## 2021-02-04 MED ORDER — KETOROLAC TROMETHAMINE 30 MG/ML IJ SOLN
INTRAMUSCULAR | Status: AC
  Administered 2021-02-04: 30 mg via INTRAVENOUS
  Filled 2021-02-04: qty 1

## 2021-02-04 MED ORDER — ONDANSETRON 8 MG PO TBDP: 8.0000 mg | ORAL_TABLET | Freq: Three times a day (TID) | ORAL | 0 refills | Status: AC | PRN

## 2021-02-04 MED ORDER — CEFAZOLIN SODIUM-DEXTROSE 2-4 GM/100ML-% IV SOLN
INTRAVENOUS | Status: AC
  Filled 2021-02-04: qty 100

## 2021-02-04 MED ORDER — KETOROLAC TROMETHAMINE 30 MG/ML IJ SOLN: 30.0000 mg | Freq: Once | INTRAMUSCULAR | Status: AC

## 2021-02-04 MED ORDER — MEPERIDINE HCL 50 MG/ML IJ SOLN: 6.2500 mg | INTRAMUSCULAR | Status: DC | PRN

## 2021-02-04 MED ORDER — ORAL CARE MOUTH RINSE: 15.0000 mL | Freq: Once | OROMUCOSAL | Status: AC

## 2021-02-04 MED ORDER — FENTANYL CITRATE (PF) 250 MCG/5ML IJ SOLN
INTRAMUSCULAR | Status: DC | PRN
  Administered 2021-02-04 (×2): 25 ug via INTRAVENOUS
  Administered 2021-02-04: 50 ug via INTRAVENOUS

## 2021-02-04 MED ORDER — ONDANSETRON HCL 4 MG/2ML IJ SOLN
INTRAMUSCULAR | Status: DC | PRN
  Administered 2021-02-04: 4 mg via INTRAVENOUS

## 2021-02-04 MED ORDER — ONDANSETRON HCL 4 MG/2ML IJ SOLN: 4.0000 mg | Freq: Once | INTRAMUSCULAR | Status: DC | PRN

## 2021-02-04 MED ORDER — DEXAMETHASONE SODIUM PHOSPHATE 10 MG/ML IJ SOLN
INTRAMUSCULAR | Status: AC
  Filled 2021-02-04: qty 1

## 2021-02-04 MED ORDER — CEFAZOLIN SODIUM-DEXTROSE 2-4 GM/100ML-% IV SOLN
2.0000 g | INTRAVENOUS | Status: AC
  Administered 2021-02-04: 2 g via INTRAVENOUS

## 2021-02-04 MED ORDER — LACTATED RINGERS IV SOLN: INTRAVENOUS | Status: DC

## 2021-02-04 MED ORDER — MIDAZOLAM HCL 2 MG/2ML IJ SOLN
INTRAMUSCULAR | Status: AC
  Filled 2021-02-04: qty 2

## 2021-02-04 MED ORDER — POVIDONE-IODINE 10 % EX SWAB
2.0000 | Freq: Once | CUTANEOUS | Status: DC
Start: 2021-02-04 — End: 2021-02-04

## 2021-02-04 MED ORDER — PROPOFOL 10 MG/ML IV BOLUS
INTRAVENOUS | Status: AC
  Filled 2021-02-04: qty 20

## 2021-02-04 MED ORDER — FERRIC SUBSULFATE 259 MG/GM EX SOLN
CUTANEOUS | Status: AC
  Filled 2021-02-04: qty 8

## 2021-02-04 MED ORDER — WATER FOR IRRIGATION, STERILE IR SOLN
Status: DC | PRN
  Administered 2021-02-04: 1000 mL

## 2021-02-04 MED ORDER — LIDOCAINE HCL (CARDIAC) PF 100 MG/5ML IV SOSY
PREFILLED_SYRINGE | INTRAVENOUS | Status: DC | PRN
  Administered 2021-02-04: 60 mg via INTRAVENOUS

## 2021-02-04 MED ORDER — FERRIC SUBSULFATE 259 MG/GM EX SOLN
CUTANEOUS | Status: DC | PRN
  Administered 2021-02-04: 1

## 2021-02-04 MED ORDER — ONDANSETRON HCL 4 MG/2ML IJ SOLN
INTRAMUSCULAR | Status: AC
  Filled 2021-02-04: qty 2

## 2021-02-04 MED ORDER — LIDOCAINE HCL (PF) 2 % IJ SOLN
INTRAMUSCULAR | Status: AC
  Filled 2021-02-04: qty 5

## 2021-02-04 MED ORDER — FENTANYL CITRATE (PF) 100 MCG/2ML IJ SOLN
INTRAMUSCULAR | Status: AC
  Filled 2021-02-04: qty 2

## 2021-02-04 MED ORDER — SILVER SULFADIAZINE 1 % EX CREA
TOPICAL_CREAM | CUTANEOUS | Status: AC
  Filled 2021-02-04: qty 50

## 2021-02-04 MED ORDER — CHLORHEXIDINE GLUCONATE 0.12 % MT SOLN: 15.0000 mL | Freq: Once | OROMUCOSAL | Status: AC

## 2021-02-04 MED ORDER — PROPOFOL 10 MG/ML IV BOLUS
INTRAVENOUS | Status: DC | PRN
Start: 2021-02-04 — End: 2021-02-04
  Administered 2021-02-04: 200 mg via INTRAVENOUS

## 2021-02-04 MED ORDER — FENTANYL CITRATE PF 50 MCG/ML IJ SOSY: 25.0000 ug | PREFILLED_SYRINGE | INTRAMUSCULAR | Status: DC | PRN

## 2021-02-04 MED ORDER — KETOROLAC TROMETHAMINE 10 MG PO TABS: 10.0000 mg | ORAL_TABLET | Freq: Three times a day (TID) | ORAL | 0 refills | Status: AC | PRN

## 2021-02-04 SURGICAL SUPPLY — 27 items
BAG HAMPER (MISCELLANEOUS) ×2 IMPLANT
CLOTH BEACON ORANGE TIMEOUT ST (SAFETY) ×2 IMPLANT
COVER LIGHT HANDLE STERIS (MISCELLANEOUS) ×4 IMPLANT
ELECT REM PT RETURN 9FT ADLT (ELECTROSURGICAL) ×2
ELECTRODE REM PT RTRN 9FT ADLT (ELECTROSURGICAL) ×1 IMPLANT
GAUZE 4X4 16PLY ~~LOC~~+RFID DBL (SPONGE) ×2 IMPLANT
GLOVE ECLIPSE 8.0 STRL XLNG CF (GLOVE) ×2 IMPLANT
GLOVE SRG 8 PF TXTR STRL LF DI (GLOVE) ×1 IMPLANT
GLOVE SURG UNDER POLY LF SZ7 (GLOVE) ×4 IMPLANT
GLOVE SURG UNDER POLY LF SZ8 (GLOVE) ×2
GOWN STRL REUS W/TWL LRG LVL3 (GOWN DISPOSABLE) ×2 IMPLANT
GOWN STRL REUS W/TWL XL LVL3 (GOWN DISPOSABLE) ×2 IMPLANT
KIT TURNOVER KIT A (KITS) ×2 IMPLANT
LASER FIBER DISP 1000U (UROLOGICAL SUPPLIES) ×2 IMPLANT
MANIFOLD NEPTUNE II (INSTRUMENTS) ×2 IMPLANT
NS IRRIG 1000ML POUR BTL (IV SOLUTION) ×2 IMPLANT
PACK BASIC III (CUSTOM PROCEDURE TRAY) ×2
PACK SRG BSC III STRL LF ECLPS (CUSTOM PROCEDURE TRAY) ×1 IMPLANT
PAD ARMBOARD 7.5X6 YLW CONV (MISCELLANEOUS) ×2 IMPLANT
PREFILTER SMOKE EVAC (FILTER) ×2 IMPLANT
SCOPETTES 8  STERILE (MISCELLANEOUS) ×2
SCOPETTES 8 STERILE (MISCELLANEOUS) ×1 IMPLANT
SET BASIN LINEN APH (SET/KITS/TRAYS/PACK) ×2 IMPLANT
SHEET LAVH (DRAPES) ×2 IMPLANT
TOWEL OR 17X26 4PK STRL BLUE (TOWEL DISPOSABLE) ×1 IMPLANT
TUBING SMOKE EVAC CO2 (TUBING) ×2 IMPLANT
WATER STERILE IRR 1000ML POUR (IV SOLUTION) ×2 IMPLANT

## 2021-02-04 NOTE — H&P (Signed)
Preoperative History and Physical  Molly Herman is a 45 y.o. G4P4000 with No LMP recorded. Patient is perimenopausal. 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    PMH:    Past Medical History:  Diagnosis Date   Diabetes mellitus without complication (HCC)    Gestational w/ last pregnancy    PSH:    History reviewed. No pertinent surgical history.  POb/GynH:      OB History     Gravida  4   Para  4   Term  4   Preterm      AB      Living         SAB      IAB      Ectopic      Multiple      Live Births              SH:   Social History   Tobacco Use   Smoking status: Never   Smokeless tobacco: Never  Vaping Use   Vaping Use: Never used  Substance Use Topics   Alcohol use: Never    Alcohol/week: 0.0 standard drinks   Drug use: Never    FH:   History reviewed. No pertinent family history.   Allergies: No Known Allergies  Medications:       Current Facility-Administered Medications:    ceFAZolin (ANCEF) IVPB 2g/100 mL premix, 2 g, Intravenous, On Call to OR, Lazaro Arms, MD   lactated ringers infusion, , Intravenous, Continuous, Battula, Rajamani C, MD, Last Rate: 50 mL/hr at 02/04/21 1014, New Bag at 02/04/21 1014   povidone-iodine 10 % swab 2 application, 2 application, Topical, Once, Lazaro Arms, MD  Review of Systems:   Review of Systems  Constitutional: Negative for fever, chills, weight loss, malaise/fatigue and diaphoresis.  HENT: Negative for hearing loss, ear pain, nosebleeds, congestion, sore throat, neck pain, tinnitus and ear discharge.   Eyes: Negative for blurred vision, double vision, photophobia, pain, discharge and redness.  Respiratory: Negative for cough, hemoptysis, sputum production, shortness of breath, wheezing and stridor.   Cardiovascular: Negative for chest pain, palpitations, orthopnea, claudication, leg  swelling and PND.  Gastrointestinal: Positive for abdominal pain. Negative for heartburn, nausea, vomiting, diarrhea, constipation, blood in stool and melena.  Genitourinary: Negative for dysuria, urgency, frequency, hematuria and flank pain.  Musculoskeletal: Negative for myalgias, back pain, joint pain and falls.  Skin: Negative for itching and rash.  Neurological: Negative for dizziness, tingling, tremors, sensory change, speech change, focal weakness, seizures, loss of consciousness, weakness and headaches.  Endo/Heme/Allergies: Negative for environmental allergies and polydipsia. Does not bruise/bleed easily.  Psychiatric/Behavioral: Negative for depression, suicidal ideas, hallucinations, memory loss and substance abuse. The patient is not nervous/anxious and does not have insomnia.      PHYSICAL EXAM:  Blood pressure (!) 118/45, pulse 76, temperature 98.1 F (36.7 C), temperature source Oral, resp. rate 16, SpO2 99 %.    Vitals reviewed. Constitutional: She is oriented to person, place, and time. She appears well-developed and well-nourished.  HENT:  Head: Normocephalic and atraumatic.  Right Ear: External ear normal.  Left Ear: External ear normal.  Nose: Nose normal.  Mouth/Throat: Oropharynx is clear and moist.  Eyes: Conjunctivae and EOM are normal. Pupils are equal, round, and reactive to light. Right eye exhibits no discharge. Left eye exhibits no discharge. No scleral icterus.  Neck: Normal range  of motion. Neck supple. No tracheal deviation present. No thyromegaly present.  Cardiovascular: Normal rate, regular rhythm, normal heart sounds and intact distal pulses.  Exam reveals no gallop and no friction rub.   No murmur heard. Respiratory: Effort normal and breath sounds normal. No respiratory distress. She has no wheezes. She has no rales. She exhibits no tenderness.  GI: Soft. Bowel sounds are normal. She exhibits no distension and no mass. There is tenderness. There is  no rebound and no guarding.  Genitourinary:       Vulva is normal without lesions Vagina is pink moist without discharge Cervix per colposcopy Uterus is normal size, contour, position, consistency, mobility, non-tender Adnexa is negative with normal sized ovaries by sonogram  Musculoskeletal: Normal range of motion. She exhibits no edema and no tenderness.  Neurological: She is alert and oriented to person, place, and time. She has normal reflexes. She displays normal reflexes. No cranial nerve deficit. She exhibits normal muscle tone. Coordination normal.  Skin: Skin is warm and dry. No rash noted. No erythema. No pallor.  Psychiatric: She has a normal mood and affect. Her behavior is normal. Judgment and thought content normal.    Labs: Results for orders placed or performed during the hospital encounter of 02/04/21 (from the past 336 hour(s))  Glucose, capillary   Collection Time: 02/04/21 10:06 AM  Result Value Ref Range   Glucose-Capillary 97 70 - 99 mg/dL  Results for orders placed or performed during the hospital encounter of 02/02/21 (from the past 336 hour(s))  Hemoglobin A1c   Collection Time: 02/02/21 11:52 AM  Result Value Ref Range   Hgb A1c MFr Bld 5.7 (H) 4.8 - 5.6 %   Mean Plasma Glucose 116.89 mg/dL  CBC   Collection Time: 02/02/21 11:52 AM  Result Value Ref Range   WBC 5.8 4.0 - 10.5 K/uL   RBC 4.38 3.87 - 5.11 MIL/uL   Hemoglobin 14.2 12.0 - 15.0 g/dL   HCT 00.7 62.2 - 63.3 %   MCV 95.7 80.0 - 100.0 fL   MCH 32.4 26.0 - 34.0 pg   MCHC 33.9 30.0 - 36.0 g/dL   RDW 35.4 56.2 - 56.3 %   Platelets 275 150 - 400 K/uL   nRBC 0.0 0.0 - 0.2 %  Comprehensive metabolic panel   Collection Time: 02/02/21 11:52 AM  Result Value Ref Range   Sodium 137 135 - 145 mmol/L   Potassium 3.7 3.5 - 5.1 mmol/L   Chloride 106 98 - 111 mmol/L   CO2 26 22 - 32 mmol/L   Glucose, Bld 107 (H) 70 - 99 mg/dL   BUN 7 6 - 20 mg/dL   Creatinine, Ser 8.93 0.44 - 1.00 mg/dL   Calcium 8.7  (L) 8.9 - 10.3 mg/dL   Total Protein 7.2 6.5 - 8.1 g/dL   Albumin 4.1 3.5 - 5.0 g/dL   AST 17 15 - 41 U/L   ALT 17 0 - 44 U/L   Alkaline Phosphatase 73 38 - 126 U/L   Total Bilirubin 0.7 0.3 - 1.2 mg/dL   GFR, Estimated >73 >42 mL/min   Anion gap 5 5 - 15  hCG, quantitative, pregnancy   Collection Time: 02/02/21 11:52 AM  Result Value Ref Range   hCG, Beta Chain, Quant, S <1 <5 mIU/mL  Rapid HIV screen (HIV 1/2 Ab+Ag)   Collection Time: 02/02/21 11:52 AM  Result Value Ref Range   HIV-1 P24 Antigen - HIV24 NON REACTIVE NON REACTIVE  HIV 1/2 Antibodies NON REACTIVE NON REACTIVE   Interpretation (HIV Ag Ab)      A non reactive test result means that HIV 1 or HIV 2 antibodies and HIV 1 p24 antigen were not detected in the specimen.  Urinalysis, Routine w reflex microscopic Urine, Clean Catch   Collection Time: 02/02/21 11:52 AM  Result Value Ref Range   Color, Urine STRAW (A) YELLOW   APPearance CLEAR CLEAR   Specific Gravity, Urine 1.006 1.005 - 1.030   pH 7.0 5.0 - 8.0   Glucose, UA NEGATIVE NEGATIVE mg/dL   Hgb urine dipstick NEGATIVE NEGATIVE   Bilirubin Urine NEGATIVE NEGATIVE   Ketones, ur NEGATIVE NEGATIVE mg/dL   Protein, ur NEGATIVE NEGATIVE mg/dL   Nitrite NEGATIVE NEGATIVE   Leukocytes,Ua NEGATIVE NEGATIVE    EKG: Orders placed or performed during the hospital encounter of 04/18/19   ED EKG   ED EKG   EKG 12-Lead   EKG 12-Lead   EKG    Imaging Studies: No results found.    Assessment: High grade cervical dysplasia  Plan: Laser ablation of the cervix  Lazaro Arms 02/04/2021 10:57 AM
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
## 2021-02-04 NOTE — Progress Notes (Signed)
PT family and interpreter, Harriett Sine  requesting to keep pt belongings during procedure.

## 2021-02-04 NOTE — Op Note (Signed)
Preoperative Diagnosis:  High Grade Squamous Intraepithelial lesion, adequate colposcopy  Postoperative Diagnosis:  Same as above  Procedure:  Laser ablation of the cervix  Surgeon:  Lazaro Arms MD  Anaesthesia:  Laryngeal Mask Airway  Findings:  Patient had an abnormal pap smear which was evaluated in the health department with colposcopy and directed biopsies.  Pathology report returned as high Grade SIL.  The colposcopy was adequate.  As a result, the patient is admitted for laser ablation of the cervix.  Description of Note:  Patient was taken to the OR and placed in the supine position where she underwent laryngeal mask airway anaesthesia.  She was placed in the dorsal lithotomy position.  She was draped for laser.  Graves speculum was placed and 3% acetic acid used and the laser microscope employed to perform colposcopy which confirmed the office findings.  Laser was used on typical cervical settings and used to vaporized the squamocolumnar junction to  depth of  5-7 mm peripherally and 7-9 mm centrally.  Surgical margin of several mm was employed beyond the acetowhite epithelium.  Hemostasis was achieved with the laser and Monsel's solution.  Patient was awakened from anaesthesia in good stable condition and all counts were correct.  She received Ancef 2 gram and Toradol 30 mg IV preoperatively prophylactically.  Lazaro Arms, MD  02/04/2021 11:45 AM

## 2021-02-04 NOTE — Anesthesia Postprocedure Evaluation (Signed)
Anesthesia Post Note  Patient: Molly Herman  Procedure(s) Performed: LASER ABLATION of the Cervix (Cervix)  Patient location during evaluation: PACU Anesthesia Type: General Level of consciousness: awake and alert and oriented Pain management: pain level controlled Vital Signs Assessment: post-procedure vital signs reviewed and stable Respiratory status: spontaneous breathing and respiratory function stable Cardiovascular status: blood pressure returned to baseline and stable Postop Assessment: no apparent nausea or vomiting Anesthetic complications: no   No notable events documented.   Last Vitals:  Vitals:   02/04/21 1215 02/04/21 1226  BP: 122/87 118/84  Pulse: 65 62  Resp: 14 16  Temp:  (!) 36.4 C  SpO2: 100% 100%    Last Pain:  Vitals:   02/04/21 1226  TempSrc: Axillary  PainSc: 3                  Zonnie Landen C Lakelyn Straus

## 2021-02-04 NOTE — Anesthesia Procedure Notes (Signed)
Procedure Name: LMA Insertion Date/Time: 02/04/2021 11:13 AM Performed by: Lorin Glass, CRNA Pre-anesthesia Checklist: Patient identified, Emergency Drugs available, Suction available and Patient being monitored Patient Re-evaluated:Patient Re-evaluated prior to induction Oxygen Delivery Method: Circle system utilized Preoxygenation: Pre-oxygenation with 100% oxygen Induction Type: IV induction LMA: LMA inserted LMA Size: 4.0 Number of attempts: 1 Placement Confirmation: positive ETCO2 and breath sounds checked- equal and bilateral Tube secured with: Tape Dental Injury: Teeth and Oropharynx as per pre-operative assessment

## 2021-02-04 NOTE — Transfer of Care (Signed)
Immediate Anesthesia Transfer of Care Note  Patient: Parveen Freehling  Procedure(s) Performed: LASER ABLATION of the Cervix (Cervix)  Patient Location: PACU  Anesthesia Type:General  Level of Consciousness: awake  Airway & Oxygen Therapy: Patient Spontanous Breathing and Patient connected to nasal cannula oxygen  Post-op Assessment: Report given to RN and Post -op Vital signs reviewed and stable  Post vital signs: Reviewed and stable  Last Vitals:  Vitals Value Taken Time  BP 104/76   Temp    Pulse 81 02/04/21 1148  Resp    SpO2 97 % 02/04/21 1148  Vitals shown include unvalidated device data.  Last Pain:  Vitals:   02/04/21 1007  TempSrc: Oral  PainSc: 0-No pain         Complications: No notable events documented.

## 2021-02-05 ENCOUNTER — Encounter (HOSPITAL_COMMUNITY): Payer: Self-pay | Admitting: Obstetrics & Gynecology

## 2021-02-23 ENCOUNTER — Other Ambulatory Visit: Payer: Self-pay

## 2021-02-23 ENCOUNTER — Ambulatory Visit: Payer: Self-pay | Admitting: Obstetrics & Gynecology

## 2021-02-23 ENCOUNTER — Encounter: Payer: Self-pay | Admitting: Obstetrics & Gynecology

## 2021-02-23 VITALS — BP 109/79 | HR 84 | Wt 178.0 lb

## 2021-02-23 DIAGNOSIS — Z9889 Other specified postprocedural states: Secondary | ICD-10-CM

## 2021-02-23 NOTE — Progress Notes (Signed)
  HPI: Patient returns for routine postoperative follow-up having undergone laser ablation of the cervix on 02/04/2021.  The patient's immediate postoperative recovery has been unremarkable. Since hospital discharge the patient reports some discharge and cramping occasional bleeding.   Current Outpatient Medications: ketorolac (TORADOL) 10 MG tablet, Take 1 tablet (10 mg total) by mouth every 8 (eight) hours as needed., Disp: 15 tablet, Rfl: 0 Multiple Vitamins-Minerals (MULTIVITAMIN WITH MINERALS) tablet, Take 1 tablet by mouth daily., Disp: , Rfl:  ondansetron (ZOFRAN ODT) 8 MG disintegrating tablet, Take 1 tablet (8 mg total) by mouth every 8 (eight) hours as needed for nausea or vomiting. (Patient not taking: Reported on 02/23/2021), Disp: 8 tablet, Rfl: 0 OVER THE COUNTER MEDICATION, Take 1 capsule by mouth daily. Liver supplement - fruits and vegetables (Patient not taking: Reported on 02/23/2021), Disp: , Rfl:   No current facility-administered medications for this visit.    Blood pressure 109/79, pulse 84, weight 178 lb (80.7 kg).  Physical Exam: A normal post laser ablation bed with eschar and some blood Monsel's placed Diagnostic Tests:   Pathology: High-grade dysplasia  Impression: 1. S/P Laser ablation of the cervix for HSIL on colposcopy      Plan: No orders of the defined types were placed in this encounter.    Follow up: Patient needs to get a Pap smear cytology only in 6 months and every 6 months for 2 years   Lazaro Arms, MD

## 2022-02-08 ENCOUNTER — Other Ambulatory Visit: Payer: Self-pay | Admitting: Nurse Practitioner

## 2022-02-08 DIAGNOSIS — Z1231 Encounter for screening mammogram for malignant neoplasm of breast: Secondary | ICD-10-CM

## 2022-03-01 ENCOUNTER — Ambulatory Visit (HOSPITAL_COMMUNITY): Payer: Self-pay

## 2023-01-25 ENCOUNTER — Telehealth: Payer: Self-pay

## 2023-01-25 NOTE — Telephone Encounter (Signed)
Attempted follow up wellness call with interpreter services, no answer left VM   Francee Nodal RN [employer]

## 2023-02-07 ENCOUNTER — Other Ambulatory Visit: Payer: Self-pay | Admitting: Nurse Practitioner

## 2023-02-07 DIAGNOSIS — Z1231 Encounter for screening mammogram for malignant neoplasm of breast: Secondary | ICD-10-CM

## 2023-02-14 ENCOUNTER — Ambulatory Visit (HOSPITAL_COMMUNITY)
Admission: RE | Admit: 2023-02-14 | Discharge: 2023-02-14 | Disposition: A | Discharge status: Home / Self Care | Payer: Self-pay | Source: Ambulatory Visit | Attending: Nurse Practitioner | Admitting: Nurse Practitioner

## 2023-02-14 ENCOUNTER — Encounter (HOSPITAL_COMMUNITY): Payer: Self-pay

## 2023-02-14 DIAGNOSIS — Z1231 Encounter for screening mammogram for malignant neoplasm of breast: Secondary | ICD-10-CM | POA: Insufficient documentation

## 2024-02-13 ENCOUNTER — Other Ambulatory Visit: Payer: Self-pay | Admitting: Nurse Practitioner

## 2024-02-13 DIAGNOSIS — Z1231 Encounter for screening mammogram for malignant neoplasm of breast: Secondary | ICD-10-CM

## 2024-02-27 ENCOUNTER — Ambulatory Visit (HOSPITAL_COMMUNITY)
Admission: RE | Admit: 2024-02-27 | Discharge: 2024-02-27 | Disposition: A | Discharge status: Home / Self Care | Payer: Self-pay | Source: Ambulatory Visit | Attending: Nurse Practitioner | Admitting: Nurse Practitioner

## 2024-02-27 ENCOUNTER — Encounter (HOSPITAL_COMMUNITY): Payer: Self-pay

## 2024-02-27 DIAGNOSIS — Z1231 Encounter for screening mammogram for malignant neoplasm of breast: Secondary | ICD-10-CM | POA: Insufficient documentation

## 2024-03-21 NOTE — Congregational Nurse Program (Signed)
 pt requested to shop at onsite food market at [employer] for family of 3 as return visit  Pt was referred to [employer] and allowed to shop
